# Patient Record
Sex: Female | Born: 1985
Health system: Southern US, Community
[De-identification: ages and names within clinical notes are randomized; demographics above are authoritative.]

## PROBLEM LIST (undated history)

## (undated) ENCOUNTER — Inpatient Hospital Stay (HOSPITAL_COMMUNITY): Payer: Self-pay

## (undated) DIAGNOSIS — D573 Sickle-cell trait: Secondary | ICD-10-CM

## (undated) DIAGNOSIS — H5500 Unspecified nystagmus: Secondary | ICD-10-CM

## (undated) DIAGNOSIS — D649 Anemia, unspecified: Secondary | ICD-10-CM

## (undated) HISTORY — DX: Sickle-cell trait: D57.3

## (undated) HISTORY — PX: CATARACT EXTRACTION: SUR2

## (undated) HISTORY — DX: Anemia, unspecified: D64.9

---
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
## 2007-07-27 ENCOUNTER — Emergency Department (HOSPITAL_COMMUNITY): Admission: EM | Admit: 2007-07-27 | Discharge: 2007-07-27 | Payer: Self-pay | Admitting: Family Medicine

## 2007-07-28 ENCOUNTER — Inpatient Hospital Stay (HOSPITAL_COMMUNITY): Admission: AD | Admit: 2007-07-28 | Discharge: 2007-07-28 | Payer: Self-pay | Admitting: Obstetrics & Gynecology

## 2007-11-07 ENCOUNTER — Inpatient Hospital Stay (HOSPITAL_COMMUNITY): Admission: AD | Admit: 2007-11-07 | Discharge: 2007-11-07 | Payer: Self-pay | Admitting: Obstetrics & Gynecology

## 2008-02-19 ENCOUNTER — Ambulatory Visit (HOSPITAL_COMMUNITY): Admission: RE | Admit: 2008-02-19 | Discharge: 2008-02-19 | Payer: Self-pay | Admitting: Obstetrics and Gynecology

## 2008-02-28 ENCOUNTER — Inpatient Hospital Stay (HOSPITAL_COMMUNITY): Admission: AD | Admit: 2008-02-28 | Discharge: 2008-02-28 | Payer: Self-pay | Admitting: Obstetrics and Gynecology

## 2008-03-29 ENCOUNTER — Encounter (INDEPENDENT_AMBULATORY_CARE_PROVIDER_SITE_OTHER): Payer: Self-pay | Admitting: Obstetrics and Gynecology

## 2008-03-29 ENCOUNTER — Inpatient Hospital Stay (HOSPITAL_COMMUNITY): Admission: AD | Admit: 2008-03-29 | Discharge: 2008-04-01 | Payer: Self-pay | Admitting: Obstetrics and Gynecology

## 2008-12-23 ENCOUNTER — Emergency Department (HOSPITAL_COMMUNITY): Admission: EM | Admit: 2008-12-23 | Discharge: 2008-12-23 | Payer: Self-pay | Admitting: Family Medicine

## 2009-03-28 ENCOUNTER — Emergency Department (HOSPITAL_COMMUNITY): Admission: EM | Admit: 2009-03-28 | Discharge: 2009-03-28 | Payer: Self-pay | Admitting: Emergency Medicine

## 2009-06-22 ENCOUNTER — Emergency Department (HOSPITAL_COMMUNITY): Admission: EM | Admit: 2009-06-22 | Discharge: 2009-06-22 | Payer: Self-pay | Admitting: Family Medicine

## 2009-09-26 ENCOUNTER — Emergency Department (HOSPITAL_COMMUNITY): Admission: EM | Admit: 2009-09-26 | Discharge: 2009-09-26 | Payer: Self-pay | Admitting: Emergency Medicine

## 2010-01-02 ENCOUNTER — Emergency Department (HOSPITAL_COMMUNITY): Admission: EM | Admit: 2010-01-02 | Discharge: 2010-01-02 | Payer: Self-pay | Admitting: Family Medicine

## 2011-01-02 ENCOUNTER — Emergency Department (HOSPITAL_COMMUNITY)
Admission: EM | Admit: 2011-01-02 | Discharge: 2011-01-03 | Disposition: A | Discharge status: Home / Self Care | Payer: Medicaid Other | Attending: Emergency Medicine | Admitting: Emergency Medicine

## 2011-01-02 DIAGNOSIS — K219 Gastro-esophageal reflux disease without esophagitis: Secondary | ICD-10-CM | POA: Insufficient documentation

## 2011-01-02 DIAGNOSIS — R51 Headache: Secondary | ICD-10-CM | POA: Insufficient documentation

## 2011-01-26 LAB — POCT I-STAT, CHEM 8
BUN: 5 mg/dL — ABNORMAL LOW (ref 6–23)
Calcium, Ion: 1.21 mmol/L (ref 1.12–1.32)
Chloride: 102 mEq/L (ref 96–112)
Creatinine, Ser: 0.7 mg/dL (ref 0.4–1.2)
Glucose, Bld: 119 mg/dL — ABNORMAL HIGH (ref 70–99)
HCT: 40 % (ref 36.0–46.0)
Hemoglobin: 13.6 g/dL (ref 12.0–15.0)
Potassium: 4.2 mEq/L (ref 3.5–5.1)
Sodium: 138 mEq/L (ref 135–145)
TCO2: 27 mmol/L (ref 0–100)

## 2011-01-26 LAB — POCT PREGNANCY, URINE: Preg Test, Ur: NEGATIVE

## 2011-01-29 LAB — POCT PREGNANCY, URINE: Preg Test, Ur: NEGATIVE

## 2011-03-06 NOTE — H&P (Signed)
NAME:  Theresa Schroeder, Theresa Schroeder              ACCOUNT NO.:  [id number]   MEDICAL RECORD NO.:  [id number]          PATIENT TYPE:  INP   LOCATION:  9130                          FACILITY:  [facility]   PHYSICIAN:  Crist Fat. Rivard, M.D. DATE OF BIRTH:  1986-07-14   DATE OF ADMISSION:  03/29/2008  DATE OF DISCHARGE:                              HISTORY & PHYSICAL   This is a 25 year old gravida 1, para 0 at 40-2/7 weeks who presents  unannounced by EMS with complaints of contractions since 8 a.m.  She  denies leaking or bleeding and reports positive fetal movement.  Pregnancy has been followed by Dr. Pennie Rushing and remarkable for,  1. Chlamydia in January 2009.  2. Teen.  3. Late care.  4. Sickle cell trait (father of the baby is not tested).  5. Bilateral nystagmus with right strabismus.  6. HPV.  7. Group B strep negative.  8. Fetal pyelectasis on the right.  9. Abnormal Glucola with normal 3-hour GTT.   ALLERGIES:  None.   OB HISTORY:  The patient is a primigravida.   MEDICAL HISTORY:  Remarkable for chlamydia in January 2009 that was  treated and childhood varicella.  She also has a history of sickle cell  trait and HPV with warts, and she also has bilateral nystagmus with  right strabismus of her eyes.   PRENATAL LABS:  Hemoglobin 10.9, platelets 218.  Blood type O positive,  antibody screen negative, RPR nonreactive, sickle cell positive for  trait.  Hepatitis negative, HIV negative.  Cystic fibrosis negative, HSV  1 and 2 both negative.  History of current pregnancy.  The patient  entered care at [redacted] weeks gestation.  She was treated for UTI at that  time.  She had an ultrasound at 28 weeks that was normal except for  right fetal renal pyelectasis, with otherwise normal anatomy.  She had a  colposcopy in April 2009 for low-grade SIL by Dr. Pennie Rushing and had an  elevated Glucola with a normal 3-hour GTT.  Ultrasound at 33 weeks  showed normal growth and resolution of pyelectasis, and she was  group B  strep negative at term.   OBJECTIVE DATA:  VITAL SIGNS:  Stable, afebrile.  Diastolic blood  pressures have been in the 90s.  HEENT:  Within normal limits.  Thyroid normal, not enlarged.  CHEST:  Clear to auscultation.  HEART:  Regular rate and rhythm.  ABDOMEN:  Gravid at 38 cm vertex.  Leopold's exam shows reactive fetal  heart rate with contractions every 2-4 minutes.  Cervix was initially 2  cm and is now 3, 95, and -1 to -2 vertex with positive bloody show.  EXTREMITIES:  Within normal limits.   ASSESSMENT:  1. Intrauterine pregnancy at 40-2/7 weeks.  2. Early labor with cervical change.  3. Questionable pregnancy-induced hypertension versus pain effect.   PLAN:  1. Admit per Dr. Estanislado Pandy.  2. PIH labs.  3. Epidural p.r.n.      Marie L. Williams, C.N.M.      Crist Fat Rivard, M.D.  Electronically Signed    MLW/MEDQ  D:  03/29/2008  T:  03/30/2008  Job:  [id number]

## 2011-03-06 NOTE — Op Note (Signed)
NAME:  BELLATRIX, DEVONSHIRE              ACCOUNT NO.:  [id number]   MEDICAL RECORD NO.:  [id number]          PATIENT TYPE:  INP   LOCATION:  9130                          FACILITY:  [facility]   PHYSICIAN:  Crist Fat. Rivard, M.D. DATE OF BIRTH:  1985-11-23   DATE OF PROCEDURE:  DATE OF DISCHARGE:                               OPERATIVE REPORT   PREOPERATIVE DIAGNOSIS:  Intrauterine pregnancy at 40 weeks and 2 days  with nonreassuring fetal heart rate.   POSTOPERATIVE DIAGNOSIS:  Intrauterine pregnancy at 40 weeks and 2 days  with nonreassuring fetal heart rate.   ANESTHESIA:  Epidural, Dr. Jean Rosenthal.   PROCEDURE:  Primary low transverse cesarean section.   SURGEON:  Crist Fat. Rivard, MD   ASSISTANT:  Elby Showers. Mayford Knife, CNM.   ESTIMATED BLOOD LOSS:  800 mL.   PROCEDURE:  After being informed of the planned procedure with possible  complications including bleeding, infection, and injury to other organs,  informed consent was obtained.  The patient was rapidly taken to OR #1  and pre-existing epidural was reinforced while we monitored the fetal  heart rate.  During the time of reinforcing anesthesia, heart rate  varied between 110 and 125 with no recurrent deceleration.  We then were  able to prep the patient and draped her in a sterile fashion.  A Foley  catheter was already in her bladder, and the fifth scalp lead was then  removed.  After assessing adequate level of anesthesia, we infiltrated  the suprapubic area with 20 mL of Marcaine 0.25 and performed a  Pfannenstiel incision which was brought down sharply to the fascia.  The  fascia was incised in a low transverse fashion.  Linea alba was  dissected.  Peritoneum was entered bluntly.  Visceral peritoneum is  entered in a low transverse fashion allowing [place] to safely retract  bladder by developing a bladder flap.  Grete retractor was put in place  and then the myometrium was entered in a low transverse fashion, first  with knife then  extended bluntly.  Amniotic fluid was clear, and we  assisted the birth of a female infant at 1459, mouth and nose in OP  presentation.  Mouth and nose were suctioned.  Baby was delivered.  A  cord loop around the right ankle was reduced.  Cord was clamped with 2  Kelly clamps and sectioned, and the baby was given to Dr. Joana Reamer,  neonatologist, present in the room.  Cord pH was drawn from the  umbilical artery and 10 mL was drawn from the umbilical vein.  Ancef 1 g  was given IV to the patient.  The placenta was delivered spontaneously.  It was complete.  Cord has 3 vessels and uterine revision was negative.   The myometrium was closed in 2 layers, first with a running locked  suture of 0 Vicryl, then with a Lembert suture of 0 Vicryl imbricating  the first one.  Hemostasis was completed on peritoneal edges with  cauterization.  Both pericolic gutters were cleaned, both tubes and  ovaries assessed and normal, and the pelvis was then profusely irrigated  with warm saline to note a satisfactory hemostasis.   On the fascia, hemostasis was completed with cautery and the fascia was  closed with two running sutures of 1 Vicryl meeting midline.  The wound  was irrigated with warm saline.  Hemostasis was completed with cautery,  and the skin was closed with a subcuticular suture of 3-0 Monocryl and  Steri-Strips.   Instruments and sponge count was complete x2.  Estimated blood loss was  800 mL.  The procedure was very well tolerated by the patient who was  taken to recovery room in a well and stable condition.   Little boy named Cristal Deer was born at 2:59 p.m., received an Apgar of  9 at 1 minute and 9 at 5 minutes, weighed 8 pounds 4 ounces, and had a  cord pH of 7.22.   SPECIMEN:  Placenta was sent to pathology.      Crist Fat Rivard, M.D.  Electronically Signed     SAR/MEDQ  D:  03/29/2008  T:  03/30/2008  Job:  [id number]

## 2011-03-06 NOTE — Discharge Summary (Signed)
Theresa Schroeder, Theresa Schroeder              ACCOUNT NO.:  [id number]   MEDICAL RECORD NO.:  [id number]          PATIENT TYPE:  INP   LOCATION:  9130                          [facility]ACILITY:  [facility]   PHYSICIAN:  Hal Morales, M.D.DATE OF BIRTH:  October 30, 1985   DATE OF ADMISSION:  03/29/2008  DATE OF DISCHARGE:  04/01/2008                               DISCHARGE SUMMARY   ADMITTING DIAGNOSES:  1. Intrauterine pregnancy at 40 and 2/7th weeks.  2. Early labor.   DISCHARGE DIAGNOSES:  1. Intrauterine pregnancy at 40 and 2/7th weeks.  2. Nonreassuring fetal heart rate.   PROCEDURES:  1. Primary low transverse cesarean section.  2. Epidural anesthesia.   HOSPITAL COURSE:  Theresa Schroeder is a 25 year old gravida 1, para 0, 40 and  2/7th weeks who presented in early labor on the morning of March 29, 2008.  Her pregnancy had been remarkable for,  1. Chlamydia in 2009.  2. Teen pregnancy.  3. Late care.  4. Sickle cell trait without father of the baby being tested.  5. Bilateral nystagmus with right strabismus.  6. HPV.  7. Group B strep negative.  8. Fetal pyelectasis in the right.  9. Abnormal glucola with normal 3-hour GTT.   On admission, cervix was 395% vertex -1 to -2.  Uterine contractions  every 2-4 minutes.  She did have some mild elevation of her diastolic  blood pressures.  However, this did resolve with pain management.  She  was admitted to labor and delivery and received an epidural.  At 1:00  p.m. her cervix 490% vertex at -1 station with bulging membranes.  Artificial rupture of membranes was accomplished with clear fluid noted.  There was a slight decrease in the baseline, however, good variability  was noted.  There were some mild variable decelerations.  The positive  accelerations were noted.  Ph labs were negative except for uric acid of  6.2 and amnioinfusion was begun.  Over the next 2 hours, the patient had  some frequency of contractions with some episodes of tachysystole is  causing fetal heart rate decelerations.  Scalp stimulation was always  present, terbutaline subcu had been given x1.  At 2, she had had 3-  minute decelerations with return to baseline at 130s with decreased  variability.  There had been no decelerations over 3-4 contractions.  Observation was continued at that time; however, the increased vigilance  of fetal heart rate evaluation was discussed with the patient.  Over the  next 30 minutes, the patient did have a prolonged decelerations to the  70s.  Terbutaline was given, scalp stimulation was performed without  improvement.  Vaginal exam showed the patient to be 9 cm, 100% effaced,  at 0 station.  She was taken to C-section for emergent delivery.   FINDINGS:  Viable female, Theresa Schroeder, born at 2:59, Apgars were 9 and 9,  weight was 8 pounds 4 ounces, cord pH was 7.22.  There was 1 cord loop  around the right leg.  Placenta was sent to pathology.  The patient  tolerated the procedure well was taken to recovery in good condition.  Infant was taken to the full-term nursery.  By postop day #1, the  patient was doing well.  Her hemoglobin was 8.2 down from 10.5, white  blood cell count was 13.6, and platelet count was 161,000.  The patient  was having no syncope or dizziness.  She was bottle feeding.  Initially,  during her pregnancy, she had discussed giving the baby up for adoption;  however, she elected to keep the baby and did have good support from her  mom.  Rest of the patient's hospital stay was uncomplicated.  By postop  day 3, she was doing well.  She was up ad lib.  She was bottle feeding.  She was undecided about birth control.  Her incision was clean, dry, and  intact.  The subcuticular sutures and Steri-Strips noted.  She was  deemed to receive full benefit of hospital stay and was discharged home.   DISCHARGE INSTRUCTIONS:  Per [facility] handout.   DISCHARGE MEDICATIONS:  1. Motrin 600 mg p.o. q.6 h. p.r.n. pain.   2. Percocet 1-2 p.o. q.3-4 h. p.r.n. pain.  3. Iron supplement 1 p.o. daily will be continued as previous to      admission.   DISCHARGE FOLLOW-UP:  Occur in 6 weeks [facility] or p.r.n.      Theresa Schroeder, C.N.M.      Hal Morales, M.D.  Electronically Signed    VLL/MEDQ  D:  04/01/2008  T:  04/01/2008  Job:  [id number]

## 2011-03-24 ENCOUNTER — Ambulatory Visit (INDEPENDENT_AMBULATORY_CARE_PROVIDER_SITE_OTHER): Payer: Medicaid Other

## 2011-03-24 ENCOUNTER — Inpatient Hospital Stay (INDEPENDENT_AMBULATORY_CARE_PROVIDER_SITE_OTHER)
Admission: RE | Admit: 2011-03-24 | Discharge: 2011-03-24 | Disposition: A | Discharge status: Home / Self Care | Payer: Medicaid Other | Source: Ambulatory Visit | Attending: Family Medicine | Admitting: Family Medicine

## 2011-03-24 DIAGNOSIS — S93409A Sprain of unspecified ligament of unspecified ankle, initial encounter: Secondary | ICD-10-CM

## 2011-07-12 LAB — RUBELLA SCREEN: Rubella: 30.6 — ABNORMAL HIGH

## 2011-07-12 LAB — DIFFERENTIAL
Eosinophils Absolute: 0
Lymphocytes Relative: 19
Lymphs Abs: 1.1
Monocytes Relative: 6
Neutro Abs: 4.6
Neutrophils Relative %: 75

## 2011-07-12 LAB — GC/CHLAMYDIA PROBE AMP, GENITAL: Chlamydia, DNA Probe: POSITIVE — AB

## 2011-07-12 LAB — CBC
MCV: 92.4
RBC: 3.11 — ABNORMAL LOW
WBC: 6.1

## 2011-07-12 LAB — WET PREP, GENITAL
Clue Cells Wet Prep HPF POC: NONE SEEN
Trich, Wet Prep: NONE SEEN
Yeast Wet Prep HPF POC: NONE SEEN

## 2011-07-12 LAB — SICKLE CELL SCREEN: Sickle Cell Screen: POSITIVE — AB

## 2011-07-12 LAB — URINALYSIS, ROUTINE W REFLEX MICROSCOPIC
Glucose, UA: NEGATIVE
Hgb urine dipstick: NEGATIVE
Protein, ur: NEGATIVE
pH: 6

## 2011-07-12 LAB — URINE MICROSCOPIC-ADD ON

## 2011-07-12 LAB — RPR: RPR Ser Ql: NONREACTIVE

## 2011-07-12 LAB — HEPATITIS B SURFACE ANTIGEN: Hepatitis B Surface Ag: NEGATIVE

## 2011-07-12 LAB — ABO/RH: ABO/RH(D): O POS

## 2011-07-12 LAB — URINE CULTURE

## 2011-07-19 LAB — CBC
HCT: 23.9 — ABNORMAL LOW
Hemoglobin: 8.2 — ABNORMAL LOW
MCHC: 34.1
Platelets: 227
RBC: 2.89 — ABNORMAL LOW
RDW: 15.5
RDW: 15.7 — ABNORMAL HIGH
WBC: 8.8

## 2011-07-19 LAB — COMPREHENSIVE METABOLIC PANEL
AST: 18
Albumin: 3 — ABNORMAL LOW
Alkaline Phosphatase: 149 — ABNORMAL HIGH
BUN: 3 — ABNORMAL LOW
Chloride: 105
GFR calc Af Amer: >60
Potassium: 3.8
Total Bilirubin: 0.6
Total Protein: 6.4

## 2011-07-19 LAB — URIC ACID: Uric Acid, Serum: 6.2

## 2011-07-19 LAB — RPR: RPR Ser Ql: NONREACTIVE

## 2011-08-02 LAB — CBC
HCT: 35.2 — ABNORMAL LOW
Hemoglobin: 12.1
MCV: 87.8
RBC: 4.01
WBC: 12 — ABNORMAL HIGH

## 2011-08-02 LAB — POCT URINALYSIS DIP (DEVICE)
Glucose, UA: NEGATIVE
Specific Gravity, Urine: 1.01
pH: 6

## 2011-08-02 LAB — URINE CULTURE: Colony Count: >=100000

## 2011-08-02 LAB — URINALYSIS, ROUTINE W REFLEX MICROSCOPIC
Glucose, UA: 100 — AB
Ketones, ur: 15 — AB
Protein, ur: 30 — AB

## 2011-08-02 LAB — WET PREP, GENITAL

## 2011-08-02 LAB — RPR: RPR Ser Ql: NONREACTIVE

## 2011-08-02 LAB — POCT PREGNANCY, URINE: Operator id: 200941

## 2011-08-02 LAB — HCG, QUANTITATIVE, PREGNANCY: hCG, Beta Chain, Quant, S: 12854 — ABNORMAL HIGH

## 2011-08-02 LAB — GC/CHLAMYDIA PROBE AMP, GENITAL
Chlamydia, DNA Probe: NEGATIVE
GC Probe Amp, Genital: NEGATIVE

## 2011-10-23 NOTE — L&D Delivery Note (Signed)
Delivery Note Pushed well with rapid progression to crowing.  At 8:27 PM a viable and healthy female was delivered via Vaginal, Spontaneous Delivery (Presentation: ; Occiput Anterior).  APGAR: 9, 9; weight 6 lb 8.8 oz (2970 g).   Placenta status: Intact, Spontaneous.  Cord: 3 vessels with the following complications: None.  Cord pH:   No difficulty with shoulders Nuchal cord X 1, delivered through  Anesthesia: Epidural  Episiotomy:  Lacerations: 2nd degree;Labial Suture Repair: 3.0 vicryl Est. Blood Loss (mL):   Mom to postpartum.  Baby to nursery-stable.  [facility] 01/25/2012, 8:51 PM

## 2011-12-31 ENCOUNTER — Encounter: Payer: Self-pay | Admitting: Obstetrics & Gynecology

## 2011-12-31 ENCOUNTER — Encounter (HOSPITAL_COMMUNITY): Payer: Self-pay

## 2011-12-31 ENCOUNTER — Inpatient Hospital Stay (HOSPITAL_COMMUNITY): Payer: Medicaid Other

## 2011-12-31 ENCOUNTER — Inpatient Hospital Stay (HOSPITAL_COMMUNITY)
Admission: AD | Admit: 2011-12-31 | Discharge: 2011-12-31 | Disposition: A | Discharge status: Home / Self Care | Payer: Medicaid Other | Source: Ambulatory Visit | Attending: Obstetrics & Gynecology | Admitting: Obstetrics & Gynecology

## 2011-12-31 DIAGNOSIS — O368190 Decreased fetal movements, unspecified trimester, not applicable or unspecified: Secondary | ICD-10-CM | POA: Insufficient documentation

## 2011-12-31 DIAGNOSIS — Z3490 Encounter for supervision of normal pregnancy, unspecified, unspecified trimester: Secondary | ICD-10-CM | POA: Insufficient documentation

## 2011-12-31 DIAGNOSIS — O0930 Supervision of pregnancy with insufficient antenatal care, unspecified trimester: Secondary | ICD-10-CM | POA: Insufficient documentation

## 2011-12-31 HISTORY — DX: Unspecified nystagmus: H55.00

## 2011-12-31 LAB — DIFFERENTIAL
Eosinophils Absolute: 0.1 10*3/uL (ref 0.0–0.7)
Eosinophils Relative: 1 % (ref 0–5)
Lymphs Abs: 1.4 10*3/uL (ref 0.7–4.0)

## 2011-12-31 LAB — HIV ANTIBODY (ROUTINE TESTING W REFLEX): HIV: NONREACTIVE

## 2011-12-31 LAB — CBC
MCH: 28.5 pg (ref 26.0–34.0)
MCV: 85.9 fL (ref 78.0–100.0)
Platelets: 241 10*3/uL (ref 150–400)
RBC: 3.33 MIL/uL — ABNORMAL LOW (ref 3.87–5.11)

## 2011-12-31 LAB — HEPATITIS B SURFACE ANTIGEN: Hepatitis B Surface Ag: NEGATIVE

## 2011-12-31 LAB — GC/CHLAMYDIA PROBE AMP, GENITAL: Chlamydia: NEGATIVE

## 2011-12-31 MED ORDER — FERROUS SULFATE 325 (65 FE) MG PO TABS: 325.0000 mg | ORAL_TABLET | Freq: Every day | ORAL | Status: DC

## 2011-12-31 NOTE — MAU Provider Note (Signed)
Medical Screening exam and patient care preformed by advanced practice provider.  Agree with the above management.  

## 2011-12-31 NOTE — MAU Provider Note (Signed)
History     No chief complaint on file.  HPI 26 yo G3P1011 at 35 weeks by LMP with no prenatal care presenting to MAU with chief complaint of "baby not moving much" for 2 weeks Last time she felt increased movement was a few days ago. States she has some movement but not as forceful as before. No contractions, some pelvic pressure. No vaginal bleeding, no loss of fluid. Has some mucous-like discharge  OB History    Grav Para Term Preterm Abortions TAB SAB Ect Mult Living   3 1 1  0 1 1 0 0 0 1    Last delivery was C-section at [facility] in June 2009 due to fetal distress? Abortion in [place] 03/03/11- Found through record search. Patient is embarrassed by this and would not like family members to know.  Past Medical History  Diagnosis Date  . Nystagmus     Past Surgical History  Procedure Date  . Cesarean section     Family History  Problem Relation Age of Onset  . Anesthesia problems Neg Hx     History  Substance Use Topics  . Smoking status: Never Smoker   . Smokeless tobacco: Not on file  . Alcohol Use:     Allergies: No Known Allergies  Prescriptions prior to admission  Medication Sig Dispense Refill  . Prenatal Vit-Fe Fumarate-FA (PRENATAL MULTIVITAMIN) TABS Take 1 tablet by mouth daily.      Marland Kitchen DISCONTD: ibuprofen (ADVIL,MOTRIN) 200 MG tablet Take 400 mg by mouth daily as needed. For headache        Review of Systems  Constitutional: Negative for fever and chills.  Respiratory: Negative for shortness of breath.   Cardiovascular: Negative for chest pain.  Gastrointestinal: Positive for vomiting. Negative for diarrhea and constipation.  Genitourinary: Negative for dysuria.  Musculoskeletal: Negative for back pain.  Skin: Negative for rash.  Neurological: Positive for headaches. Negative for dizziness.  Psychiatric/Behavioral: Negative for depression and suicidal ideas.   Physical Exam   Last menstrual period 04/30/2011, unknown if currently  breastfeeding.  Physical Exam  Constitutional: She is oriented to person, place, and time. She appears well-developed and well-nourished. No distress.  HENT:  Head: Normocephalic and atraumatic.  Eyes:       Nystagmus at baseline  Neck: Normal range of motion.  Cardiovascular: Normal rate and regular rhythm.   No murmur heard. Respiratory: Effort normal and breath sounds normal. She has no wheezes.  GI: Soft.       Gravid. Toco in place.  Musculoskeletal: Normal range of motion. She exhibits no edema and no tenderness.  Neurological: She is alert and oriented to person, place, and time.  Skin: Skin is dry. No rash noted.    MAU Course  Procedures Results for orders placed during the hospital encounter of 12/31/11 (from the past 24 hour(s))  CBC     Status: Abnormal   Collection Time   12/31/11 12:25 PM      Component Value Range   WBC 7.6  4.0 - 10.5 (K/uL)   RBC 3.33 (*) 3.87 - 5.11 (MIL/uL)   Hemoglobin 9.5 (*) 12.0 - 15.0 (g/dL)   HCT 16.1 (*) 09.6 - 46.0 (%)   MCV 85.9  78.0 - 100.0 (fL)   MCH 28.5  26.0 - 34.0 (pg)   MCHC 33.2  30.0 - 36.0 (g/dL)   RDW 04.5  40.9 - 81.1 (%)   Platelets 241  150 - 400 (K/uL)  DIFFERENTIAL  Status: Normal   Collection Time   12/31/11 12:25 PM      Component Value Range   Neutrophils Relative 75  43 - 77 (%)   Neutro Abs 5.7  1.7 - 7.7 (K/uL)   Lymphocytes Relative 18  12 - 46 (%)   Lymphs Abs 1.4  0.7 - 4.0 (K/uL)   Monocytes Relative 6  3 - 12 (%)   Monocytes Absolute 0.5  0.1 - 1.0 (K/uL)   Eosinophils Relative 1  0 - 5 (%)   Eosinophils Absolute 0.1  0.0 - 0.7 (K/uL)   Basophils Relative 0  0 - 1 (%)   Basophils Absolute 0.0  0.0 - 0.1 (K/uL)  TYPE AND SCREEN     Status: Normal   Collection Time   12/31/11 12:26 PM      Component Value Range   ABO/RH(D) O POS     Antibody Screen NEG     Sample Expiration 01/03/2012      MDM Patient has no prenatal care, and states she has decreased fetal movement. Will order OB+14  ultrasound for growth, and BPP for decreased movement. Since she has not had prenatal care, will order initial OB labs. Patient's dating by LMP is 35w 0d. By late ultrasound, [redacted]w[redacted]d. Since GBS is only good for 4 weeks, this has been deferred given her unsure dating. Will do CG/Ch on Urine. Pelvic exam deferred since she has no contractions, no pain, no LOF.  No contractions noted on monitor. FHT reassuring with moderate variability, baseline in the 140's.  Assessment and Plan  26 yo G3P1011 at 35 weeks by LMP presenting for decreased fetal movement - US shows normal IUP at [redacted]w[redacted]d. BPP within normal limits - Initial OB labs completed today. - Will give Rx for iron to take daily given HgB of 9.5 - Patient will follow up in [facility] on 3/27 at 9:30am. Will need GBS and possibly pap smear at that appointment. Patient is aware of this appointment and was encouraged multiple times to keep appointment. - Discussed with Dr. Adrian Blackwater and Georges Mouse who agree with above plan.  Edmundo Tedesco 12/31/2011, 2:24 PM

## 2011-12-31 NOTE — Plan of Care (Signed)
Patient is not in the lobby when called to a room in MAU.  

## 2012-01-01 LAB — GC/CHLAMYDIA PROBE AMP, URINE: GC Probe Amp, Urine: NEGATIVE

## 2012-01-02 ENCOUNTER — Encounter: Payer: Self-pay | Admitting: Obstetrics & Gynecology

## 2012-01-02 ENCOUNTER — Other Ambulatory Visit: Payer: Self-pay | Admitting: Obstetrics & Gynecology

## 2012-01-02 DIAGNOSIS — Z130 Encounter for screening for diseases of the blood and blood-forming organs and certain disorders involving the immune mechanism: Secondary | ICD-10-CM

## 2012-01-02 LAB — SICKLE CELL SCREEN: Sickle Cell Screen: POSITIVE — AB

## 2012-01-02 NOTE — Progress Notes (Signed)
Future lab order noted, will be drawn at pts OB appt.

## 2012-01-16 ENCOUNTER — Other Ambulatory Visit: Payer: Self-pay | Admitting: *Deleted

## 2012-01-16 ENCOUNTER — Encounter: Payer: Self-pay | Admitting: Advanced Practice Midwife

## 2012-01-16 ENCOUNTER — Other Ambulatory Visit (HOSPITAL_COMMUNITY)
Admission: RE | Admit: 2012-01-16 | Discharge: 2012-01-16 | Disposition: A | Discharge status: Home / Self Care | Payer: Medicaid Other | Source: Ambulatory Visit | Attending: Advanced Practice Midwife | Admitting: Advanced Practice Midwife

## 2012-01-16 ENCOUNTER — Ambulatory Visit (INDEPENDENT_AMBULATORY_CARE_PROVIDER_SITE_OTHER): Payer: Medicaid Other | Admitting: Advanced Practice Midwife

## 2012-01-16 VITALS — BP 131/89 | Temp 97.0°F | Ht 62.0 in | Wt 186.4 lb

## 2012-01-16 DIAGNOSIS — D573 Sickle-cell trait: Secondary | ICD-10-CM

## 2012-01-16 DIAGNOSIS — O0930 Supervision of pregnancy with insufficient antenatal care, unspecified trimester: Secondary | ICD-10-CM

## 2012-01-16 DIAGNOSIS — Z3490 Encounter for supervision of normal pregnancy, unspecified, unspecified trimester: Secondary | ICD-10-CM

## 2012-01-16 DIAGNOSIS — O34219 Maternal care for unspecified type scar from previous cesarean delivery: Secondary | ICD-10-CM

## 2012-01-16 DIAGNOSIS — Z113 Encounter for screening for infections with a predominantly sexual mode of transmission: Secondary | ICD-10-CM | POA: Insufficient documentation

## 2012-01-16 DIAGNOSIS — Z130 Encounter for screening for diseases of the blood and blood-forming organs and certain disorders involving the immune mechanism: Secondary | ICD-10-CM

## 2012-01-16 DIAGNOSIS — Z01419 Encounter for gynecological examination (general) (routine) without abnormal findings: Secondary | ICD-10-CM | POA: Insufficient documentation

## 2012-01-16 LAB — POCT URINALYSIS DIP (DEVICE)
Bilirubin Urine: NEGATIVE
Glucose, UA: NEGATIVE mg/dL
Hgb urine dipstick: NEGATIVE
Nitrite: NEGATIVE

## 2012-01-16 MED ORDER — ONDANSETRON HCL 4 MG PO TABS: 4.0000 mg | ORAL_TABLET | Freq: Three times a day (TID) | ORAL | Status: DC | PRN

## 2012-01-16 MED ORDER — ONDANSETRON HCL 8 MG PO TABS: 4.0000 mg | ORAL_TABLET | Freq: Three times a day (TID) | ORAL | Status: AC | PRN

## 2012-01-16 NOTE — Progress Notes (Signed)
Pulse- 108  Edema-ankles.  Pressure- right side.  Vaginal discharge- clear Pt c/o "cough a lot, constipation, vomiting throughout the day, continues to feel nauseous" Pt given education booklet and WIC info.

## 2012-01-16 NOTE — Progress Notes (Signed)
Subjective:    Theresa Schroeder is a [id number] [redacted]w[redacted]d being seen today for her first obstetrical visit.  Her obstetrical history is significant for h/o C/S 3 years ago, sickle cell screen +, late to prenatal care. Patient does intend to breast feed. Pregnancy history fully reviewed.  Patient reports nausea, no bleeding, no contractions, no cramping, no leaking and vomiting.  One concern is that baby is not moving as much as before.  Was late to care because she felt like she was otherwise healthy and didn't need it.  Filed Vitals:   01/16/12 0928 01/16/12 0934  BP: 131/89   Temp: 97 F (36.1 C)   Height:  5\' 2"  (1.575 m)  Weight: 186 lb 6.4 oz (84.55 kg)     HISTORY: OB History    Grav Para Term Preterm Abortions TAB SAB Ect Mult Living   3 1 1  0 1 1 0 0 0 1     # Outc Date GA Lbr Len/2nd Wgt Sex Del Anes PTL Lv   1 TRM 6/09 [redacted]w[redacted]d  8lb4oz(3.742kg) F LTCS EPI  Yes   Comments: fetal distress   2 TAB 5/12           Comments: "alot of pain in legs in pregnancy, stomach area"   3 CUR              Past Medical History  Diagnosis Date  . Nystagmus   . Anemia     sickle cell anemia   Past Surgical History  Procedure Date  . Cesarean section    Family History  Problem Relation Age of Onset  . Anesthesia problems Neg Hx      Exam    Uterine Size: 34 cm and size equals dates  Pelvic Exam:    Perineum: No Hemorrhoids   Vulva: normal   Vagina:  normal mucosa, normal discharge       Cervix: no bleeding following Pap, no cervical motion tenderness and no lesions   Adnexa: normal adnexa      System:     Skin: normal coloration and turgor, no rashes    Neurologic: oriented, normal, grossly non-focal, nystagmus bilaterally, left eye worse than right; stabismus   Extremities: normal strength, tone, and muscle mass, no deformities, no erythema, induration, or nodules   HEENT PERRLA, thyroid without masses and strabismus and nystagmus   Mouth/Teeth mucous membranes moist,  pharynx normal without lesions and dental hygiene poor   Neck supple   Cardiovascular: regular rate and rhythm, no murmurs or gallops   Respiratory:  appears well, vitals normal, no respiratory distress, acyanotic, normal RR, ear and throat exam is normal, neck free of mass or lymphadenopathy, chest clear, no wheezing, crepitations, rhonchi, normal symmetric air entry   Abdomen: gravid          Assessment:    Pregnancy: G3P1011 Patient Active Problem List  Diagnoses  . Supervision of normal pregnancy  . Sickle cell test positive, need hgb electrophoresis        Plan:     Initial labs drawn in MAU. PAP, GBS done today. UNABLE TO DRAWN 1HR GTT due to patient vomiting.  Will need to repeat.  Sickle cell screen positive; additional labs drawn for confirmation today (electrophoresis).  Prenatal vitamins and iron. Problem list reviewed and updated. Genetic Screening: too late to care  Ultrasound discussed;  Follow up in 1 weeks.  VBAC CONSENT DONE. Pt would like to try TOLAC, risks and benefits discussed. Consent  signed. Patient given a copy to take home with her.     Wendall Isabell 01/16/2012

## 2012-01-16 NOTE — Patient Instructions (Addendum)
Come back tomorrow for your sugar test. I am sending in a medicine for you to take 30 minutes BEFORE you come for your test to help with nausea.  Pregnancy - Third Trimester The third trimester of pregnancy (the last 3 months) is a period of the most rapid growth for you and your baby. The baby approaches a length of 20 inches and a weight of 6 to 10 pounds. The baby is adding on fat and getting ready for life outside your body. While inside, babies have periods of sleeping and waking, suck their thumbs, and hiccups. You can often feel small contractions of the uterus. This is false labor. It is also called Braxton-Hicks contractions. This is like a practice for labor. The usual problems in this stage of pregnancy include more difficulty breathing, swelling of the hands and feet from water retention, and having to urinate more often because of the uterus and baby pressing on your bladder.  PRENATAL EXAMS  Blood work may continue to be done during prenatal exams. These tests are done to check on your health and the probable health of your baby. Blood work is used to follow your blood levels (hemoglobin). Anemia (low hemoglobin) is common during pregnancy. Iron and vitamins are given to help prevent this. You may also continue to be checked for diabetes. Some of the past blood tests may be done again.   The size of the uterus is measured during each visit. This makes sure your baby is growing properly according to your pregnancy dates.   Your blood pressure is checked every prenatal visit. This is to make sure you are not getting toxemia.   Your urine is checked every prenatal visit for infection, diabetes and protein.   Your weight is checked at each visit. This is done to make sure gains are happening at the suggested rate and that you and your baby are growing normally.   Sometimes, an ultrasound is performed to confirm the position and the proper growth and development of the baby. This is a test  done that bounces harmless sound waves off the baby so your caregiver can more accurately determine due dates.   Discuss the type of pain medication and anesthesia you will have during your labor and delivery.   Discuss the possibility and anesthesia if a Cesarean Section might be necessary.   Inform your caregiver if there is any mental or physical violence at home.  Sometimes, a specialized non-stress test, contraction stress test and biophysical profile are done to make sure the baby is not having a problem. Checking the amniotic fluid surrounding the baby is called an amniocentesis. The amniotic fluid is removed by sticking a needle into the belly (abdomen). This is sometimes done near the end of pregnancy if an early delivery is required. In this case, it is done to help make sure the baby's lungs are mature enough for the baby to live outside of the womb. If the lungs are not mature and it is unsafe to deliver the baby, an injection of cortisone medication is given to the mother 1 to 2 days before the delivery. This helps the baby's lungs mature and makes it safer to deliver the baby. CHANGES OCCURING IN THE THIRD TRIMESTER OF PREGNANCY Your body goes through many changes during pregnancy. They vary from person to person. Talk to your caregiver about changes you notice and are concerned about.  During the last trimester, you have probably had an increase in your appetite. It  is normal to have cravings for certain foods. This varies from person to person and pregnancy to pregnancy.   You may begin to get stretch marks on your hips, abdomen, and breasts. These are normal changes in the body during pregnancy. There are no exercises or medications to take which prevent this change.   Constipation may be treated with a stool softener or adding bulk to your diet. Drinking lots of fluids, fiber in vegetables, fruits, and whole grains are helpful.   Exercising is also helpful. If you have been very  active up until your pregnancy, most of these activities can be continued during your pregnancy. If you have been less active, it is helpful to start an exercise program such as walking. Consult your caregiver before starting exercise programs.   Avoid all smoking, alcohol, un-prescribed drugs, herbs and "street drugs" during your pregnancy. These chemicals affect the formation and growth of the baby. Avoid chemicals throughout the pregnancy to ensure the delivery of a healthy infant.   Backache, varicose veins and hemorrhoids may develop or get worse.   You will tire more easily in the third trimester, which is normal.   The baby's movements may be stronger and more often.   You may become short of breath easily.   Your belly button may stick out.   A yellow discharge may leak from your breasts called colostrum.   You may have a bloody mucus discharge. This usually occurs a few days to a week before labor begins.  HOME CARE INSTRUCTIONS   Keep your caregiver's appointments. Follow your caregiver's instructions regarding medication use, exercise, and diet.   During pregnancy, you are providing food for you and your baby. Continue to eat regular, well-balanced meals. Choose foods such as meat, fish, milk and other low fat dairy products, vegetables, fruits, and whole-grain breads and cereals. Your caregiver will tell you of the ideal weight gain.   A physical sexual relationship may be continued throughout pregnancy if there are no other problems such as early (premature) leaking of amniotic fluid from the membranes, vaginal bleeding, or belly (abdominal) pain.   Exercise regularly if there are no restrictions. Check with your caregiver if you are unsure of the safety of your exercises. Greater weight gain will occur in the last 2 trimesters of pregnancy. Exercising helps:   Control your weight.   Get you in shape for labor and delivery.   You lose weight after you deliver.   Rest a  lot with legs elevated, or as needed for leg cramps or low back pain.   Wear a good support or jogging bra for breast tenderness during pregnancy. This may help if worn during sleep. Pads or tissues may be used in the bra if you are leaking colostrum.   Do not use hot tubs, steam rooms, or saunas.   Wear your seat belt when driving. This protects you and your baby if you are in an accident.   Avoid raw meat, cat litter boxes and soil used by cats. These carry germs that can cause birth defects in the baby.   It is easier to loose urine during pregnancy. Tightening up and strengthening the pelvic muscles will help with this problem. You can practice stopping your urination while you are going to the bathroom. These are the same muscles you need to strengthen. It is also the muscles you would use if you were trying to stop from passing gas. You can practice tightening these muscles up 10  times a set and repeating this about 3 times per day. Once you know what muscles to tighten up, do not perform these exercises during urination. It is more likely to cause an infection by backing up the urine.   Ask for help if you have financial, counseling or nutritional needs during pregnancy. Your caregiver will be able to offer counseling for these needs as well as refer you for other special needs.   Make a list of emergency phone numbers and have them available.   Plan on getting help from family or friends when you go home from the hospital.   Make a trial run to the hospital.   Take prenatal classes with the father to understand, practice and ask questions about the labor and delivery.   Prepare the baby's room/nursery.   Do not travel out of the city unless it is absolutely necessary and with the advice of your caregiver.   Wear only low or no heal shoes to have better balance and prevent falling.  MEDICATIONS AND DRUG USE IN PREGNANCY  Take prenatal vitamins as directed. The vitamin should  contain 1 milligram of folic acid. Keep all vitamins out of reach of children. Only a couple vitamins or tablets containing iron may be fatal to a baby or young child when ingested.   Avoid use of all medications, including herbs, over-the-counter medications, not prescribed or suggested by your caregiver. Only take over-the-counter or prescription medicines for pain, discomfort, or fever as directed by your caregiver. Do not use aspirin, ibuprofen (Motrin, Advil, Nuprin) or naproxen (Aleve) unless OK'd by your caregiver.   Let your caregiver also know about herbs you may be using.   Alcohol is related to a number of birth defects. This includes fetal alcohol syndrome. All alcohol, in any form, should be avoided completely. Smoking will cause low birth rate and premature babies.   Street/illegal drugs are very harmful to the baby. They are absolutely forbidden. A baby born to an addicted mother will be addicted at birth. The baby will go through the same withdrawal an adult does.  SEEK MEDICAL CARE IF: You have any concerns or worries during your pregnancy. It is better to call with your questions if you feel they cannot wait, rather than worry about them. DECISIONS ABOUT CIRCUMCISION You may or may not know the sex of your baby. If you know your baby is a boy, it may be time to think about circumcision. Circumcision is the removal of the foreskin of the penis. This is the skin that covers the sensitive end of the penis. There is no proven medical need for this. Often this decision is made on what is popular at the time or based upon religious beliefs and social issues. You can discuss these issues with your caregiver or pediatrician. SEEK IMMEDIATE MEDICAL CARE IF:   An unexplained oral temperature above 102 F (38.9 C) develops, or as your caregiver suggests.   You have leaking of fluid from the vagina (birth canal). If leaking membranes are suspected, take your temperature and tell your  caregiver of this when you call.   There is vaginal spotting, bleeding or passing clots. Tell your caregiver of the amount and how many pads are used.   You develop a bad smelling vaginal discharge with a change in the color from clear to white.   You develop vomiting that lasts more than 24 hours.   You develop chills or fever.   You develop shortness of breath.  You develop burning on urination.   You loose more than 2 pounds of weight or gain more than 2 pounds of weight or as suggested by your caregiver.   You notice sudden swelling of your face, hands, and feet or legs.   You develop belly (abdominal) pain. Round ligament discomfort is a common non-cancerous (benign) cause of abdominal pain in pregnancy. Your caregiver still must evaluate you.   You develop a severe headache that does not go away.   You develop visual problems, blurred or double vision.   If you have not felt your baby move for more than 1 hour. If you think the baby is not moving as much as usual, eat something with sugar in it and lie down on your left side for an hour. The baby should move at least 4 to 5 times per hour. Call right away if your baby moves less than that.   You fall, are in a car accident or any kind of trauma.   There is mental or physical violence at home.  Document Released: 10/02/2001 Document Revised: 09/27/2011 Document Reviewed: 04/06/2009 [facility] Patient Information 2012 [place], [place]. Contraception Choices Contraception (birth control) is the use of any methods or devices to prevent pregnancy. Below are some methods to help avoid pregnancy. HORMONAL METHODS   Contraceptive implant. This is a thin, plastic tube containing progesterone hormone. It does not contain estrogen hormone. Your caregiver inserts the tube in the inner part of the upper arm. The tube can remain in place for up to 3 years. After 3 years, the implant must be removed. The implant prevents the ovaries from  releasing an egg (ovulation), thickens the cervical mucus which prevents sperm from entering the uterus, and thins the lining of the inside of the uterus.   Progesterone-only injections. These injections are given every 3 months by your caregiver to prevent pregnancy. This synthetic progesterone hormone stops the ovaries from releasing eggs. It also thickens cervical mucus and changes the uterine lining. This makes it harder for sperm to survive in the uterus.   Birth control pills. These pills contain estrogen and progesterone hormone. They work by stopping the egg from forming in the ovary (ovulation). Birth control pills are prescribed by a caregiver.Birth control pills can also be used to treat heavy periods.   Minipill. This type of birth control pill contains only the progesterone hormone. They are taken every day of each month and must be prescribed by your caregiver.   Birth control patch. The patch contains hormones similar to those in birth control pills. It must be changed once a week and is prescribed by a caregiver.   Vaginal ring. The ring contains hormones similar to those in birth control pills. It is left in the vagina for 3 weeks, removed for 1 week, and then a new one is put back in place. The patient must be comfortable inserting and removing the ring from the vagina.A caregiver's prescription is necessary.   Emergency contraception. Emergency contraceptives prevent pregnancy after unprotected sexual intercourse. This pill can be taken right after sex or up to 5 days after unprotected sex. It is most effective the sooner you take the pills after having sexual intercourse. Emergency contraceptive pills are available without a prescription. Check with your pharmacist. Do not use emergency contraception as your only form of birth control.  BARRIER METHODS   Female condom. This is a thin sheath (latex or rubber) that is worn over the penis during sexual intercourse. It  can be used with  spermicide to increase effectiveness.   Female condom. This is a soft, loose-fitting sheath that is put into the vagina before sexual intercourse.   Diaphragm. This is a soft, latex, dome-shaped barrier that must be fitted by a caregiver. It is inserted into the vagina, along with a spermicidal jelly. It is inserted before intercourse. The diaphragm should be left in the vagina for 6 to 8 hours after intercourse.   Cervical cap. This is a round, soft, latex or plastic cup that fits over the cervix and must be fitted by a caregiver. The cap can be left in place for up to 48 hours after intercourse.   Sponge. This is a soft, circular piece of polyurethane foam. The sponge has spermicide in it. It is inserted into the vagina after wetting it and before sexual intercourse.   Spermicides. These are chemicals that kill or block sperm from entering the cervix and uterus. They come in the form of creams, jellies, suppositories, foam, or tablets. They do not require a prescription. They are inserted into the vagina with an applicator before having sexual intercourse. The process must be repeated every time you have sexual intercourse.  INTRAUTERINE CONTRACEPTION  Intrauterine device (IUD). This is a T-shaped device that is put in a woman's uterus during a menstrual period to prevent pregnancy. There are 2 types:   Copper IUD. This type of IUD is wrapped in copper wire and is placed inside the uterus. Copper makes the uterus and fallopian tubes produce a fluid that kills sperm. It can stay in place for 10 years.   Hormone IUD. This type of IUD contains the hormone progestin (synthetic progesterone). The hormone thickens the cervical mucus and prevents sperm from entering the uterus, and it also thins the uterine lining to prevent implantation of a fertilized egg. The hormone can weaken or kill the sperm that get into the uterus. It can stay in place for 5 years.  PERMANENT METHODS OF CONTRACEPTION  Female  tubal ligation. This is when the woman's fallopian tubes are surgically sealed, tied, or blocked to prevent the egg from traveling to the uterus.   Female sterilization. This is when the female has the tubes that carry sperm tied off (vasectomy).This blocks sperm from entering the vagina during sexual intercourse. After the procedure, the man can still ejaculate fluid (semen).  NATURAL PLANNING METHODS  Natural family planning. This is not having sexual intercourse or using a barrier method (condom, diaphragm, cervical cap) on days the woman could become pregnant.   Calendar method. This is keeping track of the length of each menstrual cycle and identifying when you are fertile.   Ovulation method. This is avoiding sexual intercourse during ovulation.   Symptothermal method. This is avoiding sexual intercourse during ovulation, using a thermometer and ovulation symptoms.   Post-ovulation method. This is timing sexual intercourse after you have ovulated.  Regardless of which type or method of contraception you choose, it is important that you use condoms to protect against the transmission of sexually transmitted diseases (STDs). Talk with your caregiver about which form of contraception is most appropriate for you. Document Released: 10/08/2005 Document Revised: 09/27/2011 Document Reviewed: 02/14/2011 [facility] Patient Information 2012 [place], [place]. Breastfeeding BENEFITS OF BREASTFEEDING For the baby  The first milk (colostrum) helps the baby's digestive system function better.   There are antibodies from the mother in the milk that help the baby fight off infections.   The baby has a lower incidence  of asthma, allergies, and SIDS (sudden infant death syndrome).   The nutrients in breast milk are better than formulas for the baby and helps the baby's brain grow better.   Babies who breastfeed have less gas, colic, and constipation.  For the mother  Breastfeeding helps develop a very  special bond between mother and baby.   It is more convenient, always available at the correct temperature and cheaper than formula feeding.   It burns calories in the mother and helps with losing weight that was gained during pregnancy.   It makes the uterus contract back down to normal size faster and slows bleeding following delivery.   Breastfeeding mothers have a lower risk of developing breast cancer.  NURSE FREQUENTLY  A healthy, full-term baby may breastfeed as often as every hour or space his or her feedings to every 3 hours.   How often to nurse will vary from baby to baby. Watch your baby for signs of hunger, not the clock.   Nurse as often as the baby requests, or when you feel the need to reduce the fullness of your breasts.   Awaken the baby if it has been 3 to 4 hours since the last feeding.   Frequent feeding will help the mother make more milk and will prevent problems like sore nipples and engorgement of the breasts.  BABY'S POSITION AT THE BREAST  Whether lying down or sitting, be sure that the baby's tummy is facing your tummy.   Support the breast with 4 fingers underneath the breast and the thumb above. Make sure your fingers are well away from the nipple and baby's mouth.   Stroke the baby's lips and cheek closest to the breast gently with your finger or nipple.   When the baby's mouth is open wide enough, place all of your nipple and as much of the dark area around the nipple as possible into your baby's mouth.   Pull the baby in close so the tip of the nose and the baby's cheeks touch the breast during the feeding.  FEEDINGS  The length of each feeding varies from baby to baby and from feeding to feeding.   The baby must suck about 2 to 3 minutes for your milk to get to him or her. This is called a "let down." For this reason, allow the baby to feed on each breast as long as he or she wants. Your baby will end the feeding when he or she has received the  right balance of nutrients.   To break the suction, put your finger into the corner of the baby's mouth and slide it between his or her gums before removing your breast from his or her mouth. This will help prevent sore nipples.  REDUCING BREAST ENGORGEMENT  In the first week after your baby is born, you may experience signs of breast engorgement. When breasts are engorged, they feel heavy, warm, full, and may be tender to the touch. You can reduce engorgement if you:   Nurse frequently, every 2 to 3 hours. Mothers who breastfeed early and often have fewer problems with engorgement.   Place light ice packs on your breasts between feedings. This reduces swelling. Wrap the ice packs in a lightweight towel to protect your skin.   Apply moist hot packs to your breast for 5 to 10 minutes before each feeding. This increases circulation and helps the milk flow.   Gently massage your breast before and during the feeding.  Make sure that the baby empties at least one breast at every feeding before switching sides.   Use a breast pump to empty the breasts if your baby is sleepy or not nursing well. You may also want to pump if you are returning to work or or you feel you are getting engorged.   Avoid bottle feeds, pacifiers or supplemental feedings of water or juice in place of breastfeeding.   Be sure the baby is latched on and positioned properly while breastfeeding.   Prevent fatigue, stress, and anemia.   Wear a supportive bra, avoiding underwire styles.   Eat a balanced diet with enough fluids.  If you follow these suggestions, your engorgement should improve in 24 to 48 hours. If you are still experiencing difficulty, call your lactation consultant or caregiver. IS MY BABY GETTING ENOUGH MILK? Sometimes, mothers worry about whether their babies are getting enough milk. You can be assured that your baby is getting enough milk if:  The baby is actively sucking and you hear swallowing.    The baby nurses at least 8 to 12 times in a 24 hour time period. Nurse your baby until he or she unlatches or falls asleep at the first breast (at least 10 to 20 minutes), then offer the second side.   The baby is wetting 5 to 6 disposable diapers (6 to 8 cloth diapers) in a 24 hour period by 70 to 91 days of age.   The baby is having at least 2 to 3 stools every 24 hours for the first few months. Breast milk is all the food your baby needs. It is not necessary for your baby to have water or formula. In fact, to help your breasts make more milk, it is best not to give your baby supplemental feedings during the early weeks.   The stool should be soft and yellow.   The baby should gain 4 to 7 ounces per week after he is 72 days old.  TAKE CARE OF YOURSELF Take care of your breasts by:  Bathing or showering daily.   Avoiding the use of soaps on your nipples.   Start feedings on your left breast at one feeding and on your right breast at the next feeding.   You will notice an increase in your milk supply 2 to 5 days after delivery. You may feel some discomfort from engorgement, which makes your breasts very firm and often tender. Engorgement "peaks" out within 24 to 48 hours. In the meantime, apply warm moist towels to your breasts for 5 to 10 minutes before feeding. Gentle massage and expression of some milk before feeding will soften your breasts, making it easier for your baby to latch on. Wear a well fitting nursing bra and air dry your nipples for 10 to 15 minutes after each feeding.   Only use cotton bra pads.   Only use pure lanolin on your nipples after nursing. You do not need to wash it off before nursing.  Take care of yourself by:   Eating well-balanced meals and nutritious snacks.   Drinking milk, fruit juice, and water to satisfy your thirst (about 8 glasses a day).   Getting plenty of rest.   Increasing calcium in your diet (1200 mg a day).   Avoiding foods that you  notice affect the baby in a bad way.  SEEK MEDICAL CARE IF:   You have any questions or difficulty with breastfeeding.   You need help.   You have  a hard, red, sore area on your breast, accompanied by a fever of 100.5 F (38.1 C) or more.   Your baby is too sleepy to eat well or is having trouble sleeping.   Your baby is wetting less than 6 diapers per day, by 70 days of age.   Your baby's skin or white part of his or her eyes is more yellow than it was in the hospital.   You feel depressed.  Document Released: 10/08/2005 Document Revised: 09/27/2011 Document Reviewed: 05/23/2009 [facility] Patient Information 2012 [place], [place].

## 2012-01-17 ENCOUNTER — Other Ambulatory Visit: Payer: Medicaid Other

## 2012-01-18 LAB — GLUCOSE TOLERANCE, 1 HOUR: Glucose, 1 Hour GTT: 109 mg/dL (ref 70–140)

## 2012-01-18 LAB — HEMOGLOBINOPATHY EVALUATION
Hemoglobin Other: 0 %
Hgb A: 59 % — ABNORMAL LOW (ref 96.8–97.8)

## 2012-01-21 ENCOUNTER — Encounter: Payer: Self-pay | Admitting: Advanced Practice Midwife

## 2012-01-21 DIAGNOSIS — D573 Sickle-cell trait: Secondary | ICD-10-CM | POA: Insufficient documentation

## 2012-01-21 DIAGNOSIS — O99820 Streptococcus B carrier state complicating pregnancy: Secondary | ICD-10-CM | POA: Insufficient documentation

## 2012-01-21 DIAGNOSIS — O34219 Maternal care for unspecified type scar from previous cesarean delivery: Secondary | ICD-10-CM | POA: Insufficient documentation

## 2012-01-21 DIAGNOSIS — O0930 Supervision of pregnancy with insufficient antenatal care, unspecified trimester: Secondary | ICD-10-CM | POA: Insufficient documentation

## 2012-01-21 NOTE — Progress Notes (Signed)
Baby active on exam. FKCs reviewed. GBS at NV. Denies HA, vision changes or epigastric pain. Watch BPs.

## 2012-01-22 LAB — CULTURE, BETA STREP (GROUP B ONLY)

## 2012-01-23 ENCOUNTER — Ambulatory Visit (INDEPENDENT_AMBULATORY_CARE_PROVIDER_SITE_OTHER): Payer: Medicaid Other | Admitting: Physician Assistant

## 2012-01-23 VITALS — BP 119/82 | Temp 98.6°F | Wt 187.0 lb

## 2012-01-23 DIAGNOSIS — O0930 Supervision of pregnancy with insufficient antenatal care, unspecified trimester: Secondary | ICD-10-CM

## 2012-01-23 DIAGNOSIS — Z23 Encounter for immunization: Secondary | ICD-10-CM

## 2012-01-23 DIAGNOSIS — O34219 Maternal care for unspecified type scar from previous cesarean delivery: Secondary | ICD-10-CM

## 2012-01-23 LAB — POCT URINALYSIS DIP (DEVICE)
Ketones, ur: NEGATIVE mg/dL
Protein, ur: NEGATIVE mg/dL
Specific Gravity, Urine: 1.015 (ref 1.005–1.030)

## 2012-01-23 MED ORDER — TETANUS-DIPHTH-ACELL PERTUSSIS 5-2.5-18.5 LF-MCG/0.5 IM SUSP
0.5000 mL | Freq: Once | INTRAMUSCULAR | Status: AC
  Administered 2012-01-23: 0.5 mL via INTRAMUSCULAR

## 2012-01-23 NOTE — Progress Notes (Signed)
No complaints. Reviewed positive GBS. Desires epidural in labor, Brstfeeding discussed, plans IUD pp. Labor precautions

## 2012-01-23 NOTE — Progress Notes (Signed)
Pulse-98   Pressure- right side

## 2012-01-23 NOTE — Patient Instructions (Signed)

## 2012-01-25 ENCOUNTER — Inpatient Hospital Stay (HOSPITAL_COMMUNITY)
Admission: AD | Admit: 2012-01-25 | Discharge: 2012-01-27 | DRG: 775 | Disposition: A | Discharge status: Home / Self Care | Payer: Medicaid Other | Source: Ambulatory Visit | Attending: Obstetrics & Gynecology | Admitting: Obstetrics & Gynecology

## 2012-01-25 ENCOUNTER — Encounter (HOSPITAL_COMMUNITY): Payer: Self-pay | Admitting: *Deleted

## 2012-01-25 ENCOUNTER — Inpatient Hospital Stay (HOSPITAL_COMMUNITY): Payer: Medicaid Other | Admitting: Anesthesiology

## 2012-01-25 ENCOUNTER — Encounter (HOSPITAL_COMMUNITY): Payer: Self-pay | Admitting: Anesthesiology

## 2012-01-25 DIAGNOSIS — IMO0001 Reserved for inherently not codable concepts without codable children: Secondary | ICD-10-CM

## 2012-01-25 DIAGNOSIS — O99820 Streptococcus B carrier state complicating pregnancy: Secondary | ICD-10-CM

## 2012-01-25 DIAGNOSIS — O0930 Supervision of pregnancy with insufficient antenatal care, unspecified trimester: Secondary | ICD-10-CM

## 2012-01-25 DIAGNOSIS — H5501 Congenital nystagmus: Secondary | ICD-10-CM | POA: Diagnosis present

## 2012-01-25 DIAGNOSIS — O9989 Other specified diseases and conditions complicating pregnancy, childbirth and the puerperium: Secondary | ICD-10-CM

## 2012-01-25 DIAGNOSIS — Z22330 Carrier of Group B streptococcus: Secondary | ICD-10-CM

## 2012-01-25 DIAGNOSIS — O99892 Other specified diseases and conditions complicating childbirth: Secondary | ICD-10-CM | POA: Diagnosis present

## 2012-01-25 DIAGNOSIS — O34219 Maternal care for unspecified type scar from previous cesarean delivery: Secondary | ICD-10-CM

## 2012-01-25 LAB — CBC
HCT: 29.9 % — ABNORMAL LOW (ref 36.0–46.0)
Hemoglobin: 9.7 g/dL — ABNORMAL LOW (ref 12.0–15.0)
MCV: 81.3 fL (ref 78.0–100.0)
RDW: 13.7 % (ref 11.5–15.5)
WBC: 7.9 10*3/uL (ref 4.0–10.5)

## 2012-01-25 MED ORDER — LACTATED RINGERS IV SOLN
500.0000 mL | INTRAVENOUS | Status: DC | PRN
  Administered 2012-01-25: 1000 mL via INTRAVENOUS

## 2012-01-25 MED ORDER — PHENYLEPHRINE 40 MCG/ML (10ML) SYRINGE FOR IV PUSH (FOR BLOOD PRESSURE SUPPORT)
80.0000 ug | PREFILLED_SYRINGE | INTRAVENOUS | Status: DC | PRN
  Filled 2012-01-25: qty 5

## 2012-01-25 MED ORDER — ONDANSETRON HCL 4 MG/2ML IJ SOLN
4.0000 mg | Freq: Four times a day (QID) | INTRAMUSCULAR | Status: DC | PRN
  Administered 2012-01-25: 4 mg via INTRAVENOUS
  Filled 2012-01-25: qty 2

## 2012-01-25 MED ORDER — OXYTOCIN 20 UNITS IN LACTATED RINGERS INFUSION - SIMPLE
125.0000 mL/h | Freq: Once | INTRAVENOUS | Status: AC
  Administered 2012-01-25: 500 mL/h via INTRAVENOUS

## 2012-01-25 MED ORDER — OXYTOCIN BOLUS FROM INFUSION
500.0000 mL | Freq: Once | INTRAVENOUS | Status: DC
  Filled 2012-01-25: qty 500
  Filled 2012-01-25: qty 1000

## 2012-01-25 MED ORDER — OXYCODONE-ACETAMINOPHEN 5-325 MG PO TABS: 1.0000 | ORAL_TABLET | ORAL | Status: DC | PRN

## 2012-01-25 MED ORDER — HYDROXYZINE HCL 50 MG PO TABS: 50.0000 mg | ORAL_TABLET | Freq: Four times a day (QID) | ORAL | Status: DC | PRN

## 2012-01-25 MED ORDER — PENICILLIN G POTASSIUM 5000000 UNITS IJ SOLR
5.0000 10*6.[IU] | Freq: Once | INTRAVENOUS | Status: AC
  Administered 2012-01-25: 5 10*6.[IU] via INTRAVENOUS
  Filled 2012-01-25: qty 5

## 2012-01-25 MED ORDER — ACETAMINOPHEN 325 MG PO TABS: 650.0000 mg | ORAL_TABLET | ORAL | Status: DC | PRN

## 2012-01-25 MED ORDER — EPHEDRINE 5 MG/ML INJ: 10.0000 mg | INTRAVENOUS | Status: DC | PRN

## 2012-01-25 MED ORDER — PENICILLIN G POTASSIUM 5000000 UNITS IJ SOLR: 2.5000 10*6.[IU] | INTRAVENOUS | Status: DC

## 2012-01-25 MED ORDER — EPHEDRINE 5 MG/ML INJ
10.0000 mg | INTRAVENOUS | Status: DC | PRN
  Filled 2012-01-25: qty 4

## 2012-01-25 MED ORDER — CITRIC ACID-SODIUM CITRATE 334-500 MG/5ML PO SOLN: 30.0000 mL | ORAL | Status: DC | PRN

## 2012-01-25 MED ORDER — IBUPROFEN 600 MG PO TABS
600.0000 mg | ORAL_TABLET | Freq: Four times a day (QID) | ORAL | Status: DC | PRN
  Administered 2012-01-25: 600 mg via ORAL
  Filled 2012-01-25: qty 1

## 2012-01-25 MED ORDER — SODIUM CHLORIDE 0.9 % IV SOLN: 2.0000 g | Freq: Once | INTRAVENOUS | Status: DC

## 2012-01-25 MED ORDER — PHENYLEPHRINE 40 MCG/ML (10ML) SYRINGE FOR IV PUSH (FOR BLOOD PRESSURE SUPPORT): 80.0000 ug | PREFILLED_SYRINGE | INTRAVENOUS | Status: DC | PRN

## 2012-01-25 MED ORDER — FENTANYL 2.5 MCG/ML BUPIVACAINE 1/10 % EPIDURAL INFUSION (WH - ANES)
14.0000 mL/h | INTRAMUSCULAR | Status: DC
  Administered 2012-01-25: 14 mL/h via EPIDURAL
  Filled 2012-01-25 (×2): qty 60

## 2012-01-25 MED ORDER — PENICILLIN G POTASSIUM 5000000 UNITS IJ SOLR
2.5000 10*6.[IU] | INTRAVENOUS | Status: DC
  Administered 2012-01-25: 2.5 10*6.[IU] via INTRAVENOUS
  Filled 2012-01-25 (×6): qty 2.5

## 2012-01-25 MED ORDER — PENICILLIN G POTASSIUM 5000000 UNITS IJ SOLR: 5.0000 10*6.[IU] | Freq: Once | INTRAVENOUS | Status: DC

## 2012-01-25 MED ORDER — LIDOCAINE HCL (PF) 1 % IJ SOLN
30.0000 mL | INTRAMUSCULAR | Status: DC | PRN
  Filled 2012-01-25: qty 30

## 2012-01-25 MED ORDER — LACTATED RINGERS IV SOLN: 500.0000 mL | Freq: Once | INTRAVENOUS | Status: DC

## 2012-01-25 MED ORDER — SODIUM BICARBONATE 8.4 % IV SOLN
INTRAVENOUS | Status: DC | PRN
  Administered 2012-01-25: 4 mL via EPIDURAL

## 2012-01-25 MED ORDER — DIPHENHYDRAMINE HCL 50 MG/ML IJ SOLN: 12.5000 mg | INTRAMUSCULAR | Status: DC | PRN

## 2012-01-25 MED ORDER — FLEET ENEMA 7-19 GM/118ML RE ENEM: 1.0000 | ENEMA | RECTAL | Status: DC | PRN

## 2012-01-25 MED ORDER — DIPHENHYDRAMINE HCL 50 MG/ML IJ SOLN: 50.0000 mg | Freq: Once | INTRAMUSCULAR | Status: DC

## 2012-01-25 MED ORDER — LACTATED RINGERS IV SOLN
INTRAVENOUS | Status: DC
  Administered 2012-01-25: 18:00:00 via INTRAVENOUS

## 2012-01-25 MED ORDER — HYDROXYZINE HCL 50 MG/ML IM SOLN
50.0000 mg | Freq: Four times a day (QID) | INTRAMUSCULAR | Status: DC | PRN
  Filled 2012-01-25: qty 1

## 2012-01-25 MED ORDER — FENTANYL 2.5 MCG/ML BUPIVACAINE 1/10 % EPIDURAL INFUSION (WH - ANES)
INTRAMUSCULAR | Status: DC | PRN
  Administered 2012-01-25: 14 mL/h via EPIDURAL

## 2012-01-25 MED ORDER — NALBUPHINE SYRINGE 5 MG/0.5 ML: 5.0000 mg | INJECTION | INTRAMUSCULAR | Status: DC | PRN

## 2012-01-25 NOTE — Progress Notes (Signed)
Cristela Blue MD anesthesia called and informed of pt status.  Epidural level WNL.  VSS.  Per MD it doesn't sound like an epidural issue.  No orders at this time.

## 2012-01-25 NOTE — Progress Notes (Signed)
Theresa Schroeder is a 26 y.o. G3P1011 at [redacted]w[redacted]d   Subjective: Doing well. No complaints at this time.   Objective: BP 120/78  Pulse 90  Temp(Src) 97.3 F (36.3 C) (Oral)  Resp 18  Ht 5' 3.75" (1.619 m)  Wt 85.095 kg (187 lb 9.6 oz)  BMI 32.45 kg/m2  SpO2 99%  LMP 04/30/2011  Breastfeeding? Unknown      FHT:  FHR: 140s bpm, variability: moderate,  accelerations:  Present,  decelerations:  Absent SVE:   Dilation: 5 Effacement (%): 50 Station: -1 Exam by: Shelly Flatten, MD   Labs: Lab Results  Component Value Date   WBC 7.9 01/25/2012   HGB 9.7* 01/25/2012   HCT 29.9* 01/25/2012   MCV 81.3 01/25/2012   PLT 222 01/25/2012    Assessment / Plan: Spontaneous labor, progressing normally Previous episode of difficulty breathing was likely due to anxiety. No signs of allergic reaction. Labor: Progressing normally Fetal Wellbeing:  Category I Pain Control:  Epidural I/D:  PCN starting at 14:45 Anticipated MOD:  NSVD  Theresa Schroeder 01/25/2012, 6:03 PM

## 2012-01-25 NOTE — Progress Notes (Signed)
Pt vomitting,  Voiding a large amt of urine in  The bed

## 2012-01-25 NOTE — Anesthesia Procedure Notes (Signed)

## 2012-01-25 NOTE — MAU Note (Signed)
Patient states she she has been having abdominal cramping about every 5 minutes. Hurts when turning over from side to side. Has a slight increase in clear discharge. No bleeding and reports good fetal movement. Patient had a cesarean delivery with her first pregnancy, plans a TOLAC with this pregnancy.

## 2012-01-25 NOTE — Progress Notes (Signed)
Pt with no support person in the room at this time, Continued to talk to pt and reassure her that all looks good.  Pt starting to settle down.  Pt stating that she started feeling scared after she couldn't move and turn on her side.  Pt reassurred again that is WNL with epidural.

## 2012-01-25 NOTE — Progress Notes (Signed)
Patient ID: Theresa Schroeder, female   DOB: 1986/03/20, 26 y.o.   MRN: [id number]  Feeling urge to push  FHR reassuring with small accels UCs every 2-3 min.  Dilation: 10 Dilation Complete Date: 01/25/12 Dilation Complete Time: 2000 Effacement (%): 100 Cervical Position: Middle Station: +2 Presentation: Vertex Exam by:: Katrinka Blazing, CNM  Will start pushing.

## 2012-01-25 NOTE — Progress Notes (Signed)
RN in room, pt sweating and crying and c/o SOB.  Pt breathing fast and seemingly very upset.  VSS.  Pulse ox indicating maternal tachycardia at times.  Pt instructed to slow her breathing down.  Pt also c/o right leg as tingling, and hard to move.  Offered pt reassurrance that that was normal for epidural.   Pt also c/o nausea.  Pt not noted to have any swelling in the tongue or rash noted.  PCN infusing, stopped.  Dorathy Kinsman CNM called and informed of pt status, orders for Benadryl to be placed in computer.

## 2012-01-25 NOTE — Progress Notes (Signed)
I agree with above.  Theresa Schroeder 01/25/2012 6:17 PM

## 2012-01-25 NOTE — Anesthesia Preprocedure Evaluation (Signed)

## 2012-01-25 NOTE — H&P (Signed)
Theresa Schroeder is a 26 y.o. year old G85P1011 female at [redacted]w[redacted]d weeks gestation by  Third trimester [place] (38.5 by LMP) who presents to MAU reporting Spontaneous rupture of membranes and Labor.  Maternal Medical History:  Reason for admission: Reason for admission: rupture of membranes and contractions.  Contractions: Frequency: regular.    Fetal activity: Perceived fetal activity is normal.   Last perceived fetal movement was within the past hour.    Prenatal Complications - Diabetes: none.    Patient Active Problem List  Diagnoses  . Supervision of normal pregnancy  . Sickle cell trait  . Previous cesarean delivery affecting pregnancy, antepartum  . Insufficient prenatal care  . Group B Streptococcus carrier, antepartum    OB History    Grav Para Term Preterm Abortions TAB SAB Ect Mult Living   3 1 1  0 1 1 0 0 0 1     Past Medical History  Diagnosis Date  . Nystagmus   . Anemia     sickle cell anemia   Past Surgical History  Procedure Date  . Cesarean section   . Cataract extraction    Family History: family history is negative for Anesthesia problems. Social History:  reports that she has never smoked. She has never used smokeless tobacco. She reports that she does not drink alcohol or use illicit drugs.  Review of Systems  Constitutional: Negative for fever and chills.  Eyes: Negative for blurred vision.  Neurological: Negative for headaches.    Dilation: 3.5 Effacement (%): 70 Station: -1 Exam by:: K.WIlson,RN Blood pressure 130/86, pulse 107, temperature 97.6 F (36.4 C), temperature source Oral, resp. rate 16, height 5' 3.75" (1.619 m), weight 85.095 kg (187 lb 9.6 oz), last menstrual period 04/30/2011, SpO2 100.00%, unknown if currently breastfeeding. Maternal Exam:  Abdomen: Patient reports no abdominal tenderness. Surgical scars: low transverse.   Cervix: Cervix evaluated by digital exam.     Physical Exam  Nursing note and vitals  reviewed. Constitutional: She appears well-developed and well-nourished.    Prenatal labs: ABO, Rh: --/--/O POS (03/11 1226) Antibody: NEG (03/11 1226) Rubella: 32.1 (03/11 1225) RPR: NON REACTIVE (03/11 1225)  HBsAg: NEGATIVE (03/11 1225)  HIV: NON REACTIVE (03/11 1225)  GBS:   Pos Too late for genetic screening  Assessment: 1. Labor: early, SROM clear 2. Fetal Wellbeing: Category I  3. Pain Control: undecided 4. GBS: pos 5. 36.5 week IUP  Plan:  1. Admit to BS per consult with MD 2. Routine L&D orders 3. Analgesia/anesthesia PRN  4. PCN  Katrinka Blazing, Talvin Christianson 01/25/2012 1:48 PM

## 2012-01-25 NOTE — Progress Notes (Signed)
Notified of SROM and SVE. Admit orders given

## 2012-01-26 MED ORDER — DIPHENHYDRAMINE HCL 25 MG PO CAPS: 25.0000 mg | ORAL_CAPSULE | Freq: Four times a day (QID) | ORAL | Status: DC | PRN

## 2012-01-26 MED ORDER — IBUPROFEN 600 MG PO TABS
600.0000 mg | ORAL_TABLET | Freq: Four times a day (QID) | ORAL | Status: DC
  Administered 2012-01-26 – 2012-01-27 (×6): 600 mg via ORAL
  Filled 2012-01-26 (×6): qty 1

## 2012-01-26 MED ORDER — SENNOSIDES-DOCUSATE SODIUM 8.6-50 MG PO TABS
2.0000 | ORAL_TABLET | Freq: Every day | ORAL | Status: DC
Start: 2012-01-26 — End: 2012-01-27
  Administered 2012-01-26: 2 via ORAL

## 2012-01-26 MED ORDER — BENZOCAINE-MENTHOL 20-0.5 % EX AERO: 1.0000 "application " | INHALATION_SPRAY | CUTANEOUS | Status: DC | PRN

## 2012-01-26 MED ORDER — WITCH HAZEL-GLYCERIN EX PADS: 1.0000 "application " | MEDICATED_PAD | CUTANEOUS | Status: DC | PRN

## 2012-01-26 MED ORDER — ONDANSETRON HCL 4 MG PO TABS: 4.0000 mg | ORAL_TABLET | ORAL | Status: DC | PRN

## 2012-01-26 MED ORDER — PRENATAL MULTIVITAMIN CH
1.0000 | ORAL_TABLET | Freq: Every day | ORAL | Status: DC
  Administered 2012-01-26 – 2012-01-27 (×2): 1 via ORAL
  Filled 2012-01-26 (×2): qty 1

## 2012-01-26 MED ORDER — TETANUS-DIPHTH-ACELL PERTUSSIS 5-2.5-18.5 LF-MCG/0.5 IM SUSP: 0.5000 mL | Freq: Once | INTRAMUSCULAR | Status: DC

## 2012-01-26 MED ORDER — DIBUCAINE 1 % RE OINT: 1.0000 "application " | TOPICAL_OINTMENT | RECTAL | Status: DC | PRN

## 2012-01-26 MED ORDER — SIMETHICONE 80 MG PO CHEW: 80.0000 mg | CHEWABLE_TABLET | ORAL | Status: DC | PRN

## 2012-01-26 MED ORDER — LANOLIN HYDROUS EX OINT: TOPICAL_OINTMENT | CUTANEOUS | Status: DC | PRN

## 2012-01-26 MED ORDER — OXYCODONE-ACETAMINOPHEN 5-325 MG PO TABS
1.0000 | ORAL_TABLET | ORAL | Status: DC | PRN
  Administered 2012-01-26 – 2012-01-27 (×5): 1 via ORAL
  Filled 2012-01-26 (×5): qty 1

## 2012-01-26 MED ORDER — ZOLPIDEM TARTRATE 5 MG PO TABS: 5.0000 mg | ORAL_TABLET | Freq: Every evening | ORAL | Status: DC | PRN

## 2012-01-26 MED ORDER — ONDANSETRON HCL 4 MG/2ML IJ SOLN: 4.0000 mg | INTRAMUSCULAR | Status: DC | PRN

## 2012-01-26 NOTE — Anesthesia Postprocedure Evaluation (Signed)
  Anesthesia Post-op Note  Patient: Theresa Schroeder  Procedure(s) Performed: * No procedures listed *  Patient Location: Mother/Baby  Anesthesia Type: Epidural  Level of Consciousness: awake, alert  and oriented  Airway and Oxygen Therapy: Patient Spontanous Breathing  Post-op Pain: mild  Post-op Assessment: Patient's Cardiovascular Status Stable, Respiratory Function Stable, Patent Airway, No signs of Nausea or vomiting and Pain level controlled  Post-op Vital Signs: stable  Complications: No apparent anesthesia complications

## 2012-01-26 NOTE — Progress Notes (Signed)
Post Partum Day 1 Subjective: no complaints, up ad lib, voiding and tolerating PO; breast and bottlefeeding; desires IUD for birth control.    Objective: Blood pressure 129/88, pulse 98, temperature 97.5 F (36.4 C), temperature source Oral, resp. rate 18, height 5' 3.75" (1.619 m), weight 85.095 kg (187 lb 9.6 oz), last menstrual period 04/30/2011, SpO2 99.00%, unknown if currently breastfeeding.  Physical Exam:  General: alert, cooperative and appears stated age Lochia: appropriate Uterine Fundus: firm Incision: n/a DVT Evaluation: No evidence of DVT seen on physical exam. Negative Homan's sign.   Basename 01/25/12 1343  HGB 9.7*  HCT 29.9*    Assessment/Plan: Plan for discharge tomorrow and Breastfeeding   LOS: 1 day   [facility] 01/26/2012, 8:49 AM

## 2012-01-27 MED ORDER — IBUPROFEN 600 MG PO TABS: 600.0000 mg | ORAL_TABLET | Freq: Four times a day (QID) | ORAL | Status: AC

## 2012-01-27 MED ORDER — ACETAMINOPHEN-CODEINE 300-30 MG PO TABS: 1.0000 | ORAL_TABLET | ORAL | Status: AC | PRN

## 2012-01-27 NOTE — Discharge Summary (Signed)
Obstetric Discharge Summary Reason for Admission: onset of labor and rupture of membranes Prenatal Procedures: NST and ultrasound Intrapartum Procedures: Vaginal Birth After C-Section Postpartum Procedures: none Complications-Operative and Postpartum: 2nd degree perineal laceration Hemoglobin  Date Value Range Status  01/25/2012 9.7* 12.0-15.0 (g/dL) Final     HCT  Date Value Range Status  01/25/2012 29.9* 36.0-46.0 (%) Final    Physical Exam:  General: alert, cooperative, appears stated age and no distress Lochia: appropriate Uterine Fundus: firm Incision: n/a DVT Evaluation: No evidence of DVT seen on physical exam. Negative Homan's sign.  Discharge Diagnoses: Preterm Delivery  Discharge Information: Date: 01/27/2012 Activity: pelvic rest Diet: routine Medications: PNV, Tylenol #3, Ibuprofen and Iron Condition: stable Instructions: refer to practice specific booklet Discharge to: home Follow-up Information    [facility] Schedule an appointment as soon as possible for a visit in 6 weeks.         Newborn Data: Live born female  Birth Weight: 6 lb 8.8 oz (2970 g) APGAR: 9, 9  Home with mother.  [facility] 01/27/2012, 7:35 AM

## 2012-01-27 NOTE — Discharge Summary (Signed)
Attestation of Attending Supervision of Advanced Practitioner: Evaluation and management procedures were performed by the [facility] Fellow/PA/CNM/NP under my supervision and collaboration. Chart reviewed, and agree with management and plan.  Jaynie Collins, M.D. 01/27/2012 8:23 AM

## 2012-01-27 NOTE — Progress Notes (Signed)
PSYCHOSOCIAL ASSESSMENT ~ MATERNAL/CHILD  Name: Theresa Schroeder  Age: 24 day   Referral Date: 01/26/2012 Reason/Source: Late prenatal care  I. FAMILY/HOME ENVIRONMENT  A. Child's Legal Guardian Parent(s)  Name: Theresa Schroeder  DOB: 17-Jan-1986 Age: 47  Address: [address] [place], [place] [address]   B. Family household members/Support Persons:   Maternal grandmother  Jesse Sans, age 71  C. Other support:  Maternal uncle, extended family :  II. PSYCHOSOCIAL DATA A. Information Source X Patient Interview  X family   B. Education administrator  [employer]  C. Cultural and Environment Information/Cultural Issues Impacting Care: N/A III. STRENGTHS X Supportive family/friends  X Adequate Resources  X Compliance with medical plan  X Home prepared for Child (including basic supplies)  X Understanding of illness  X Pediatrician: Dr. Chestine Spore  IV. RISK FACTORS AND CURRENT PROBLEMS  X No Problems Noted   V. SOCIAL WORK ASSESSMENT Met with MOB and baby at bedside.  Maternal grandmother and maternal uncle were present with MOB permission.  MOB lives with her 73 year old son in maternal grandmother's house.  Maternal grandmother has 2 other grandchildren and is happy for the newest addition to the family. MOB reports she was late to care due to taking a while to get Medicaid insurance set up.  She reported no problems in her pregnancy.  She is currently breastfeeding and reports things are going well.  She plans to take baby to same pediatrician as her 32 year old.  She was appropriate, engaging, and receptive to information discussed.  I advised against bedsharing due to 26 year old also bed shares and could increase risk of SIDS/roll over hazard.  Maternal grandmother stated they planned to get a bassinet today for baby.  I discussed normal newborn behavior, normal infant feeding for breastfed babies, and the importance of on-demand feeds, and  skin-to-skin contact.  I discussed the package incentives from [facility] the more breastfeeding she does and we talked about infant comfort measures.  MOB had no concerns during visit, and reported good supports.       VI. SOCIAL WORK PLAN X No Further Intervention Required/No Barriers to Discharge  X Patient/Family Education: Breastfeeding, newborn care/comfort measures  Staci Acosta, LCSW 7:36 pm

## 2012-01-28 NOTE — Progress Notes (Signed)
Post discharge chart review completed.  

## 2012-02-22 ENCOUNTER — Encounter: Payer: Self-pay | Admitting: Family Medicine

## 2012-02-22 ENCOUNTER — Ambulatory Visit (INDEPENDENT_AMBULATORY_CARE_PROVIDER_SITE_OTHER): Payer: Medicaid Other | Admitting: Family Medicine

## 2012-02-22 LAB — POCT PREGNANCY, URINE: Preg Test, Ur: NEGATIVE

## 2012-02-22 NOTE — Patient Instructions (Signed)
Intrauterine Device Insertion Most often, an intrauterine device (IUD) is inserted into the uterus to prevent pregnancy. There are 2 types of IUDs available:  Copper IUD. This type of IUD creates an environment that is not favorable to sperm survival. The mechanism of action of the copper IUD is not known for certain. It can stay in place for 10 years.   Hormone IUD. This type of IUD contains the hormone progestin (synthetic progesterone). The progestin thickens the cervical mucus and prevents sperm from entering the uterus, and it also thins the uterine lining. There is no evidence that the hormone IUD prevents implantation. The hormone IUD can stay in place for up to 5 years.  An IUD is the most cost-effective birth control if left in place for the full duration. It may be removed at any time. LET YOUR CAREGIVER KNOW ABOUT:  Sensitivity to metals.   Medicines taken including herbs, eyedrops, over-the-counter medicines, and creams.   Use of steroids (by mouth or creams).   Previous problems with anesthetics or numbing medicine.   Previous gynecological surgery.   History of blood clots or clotting disorders.   Possibility of pregnancy.   Menstrual irregularities.   Concerns regarding unusual vaginal discharge or odors.   Previous experience with an IUD.   Other health problems.  RISKS AND COMPLICATIONS  Accidental puncture (perforation) of the uterus.   Accidental placement of the IUD either in the muscle layer of the uterus (myometrium) or outside the uterus. If this happen, the IUD can be found essentially floating around the bowels. When this happens, the IUD must be taken out surgically.   The IUD may fall out of the uterus (expulsion). This is more common in women who have recently had a child.    Pregnancy in the fallopian tube (ectopic).  BEFORE THE PROCEDURE  Schedule the IUD insertion for when you will have your menstrual period or right after, to make sure you  are not pregnant. Placement of the IUD is better tolerated shortly after a menstrual cycle.   You may need to take tests or be examined to make sure you are not pregnant.   You may be required to take a pregnancy test.   You may be required to get checked for sexually transmitted infections (STIs) prior to placement. Placing an IUD in someone who has an infection can make an infection worse.   You may be given a pain reliever to take 1 or 2 hours before the procedure.   An exam will be performed to determine the size and position of your uterus.   Ask your caregiver about changing or stopping your regular medicines.  PROCEDURE   A tool (speculum) is placed in the vagina. This allows your caregiver to see the lower part of the uterus (cervix).   The cervix is prepped with a medicine that lowers the risk of infection.   You may be given a medicine to numb each side of the cervix (intracervical or paracervical block). This is used to block and control any discomfort with insertion.   A tool (uterine sound) is inserted into the uterus to determine the length of the uterine cavity and the direction the uterus may be tilted.   A slim instrument (IUD inserter) is inserted through the cervical canal and into your uterus.   The IUD is placed in the uterine cavity and the insertion device is removed.   The nylon string that is attached to the IUD, and used for   eventual IUD removal, is trimmed. It is trimmed so that it lays high in the vagina, just outside the cervix.  AFTER THE PROCEDURE  You may have bleeding after the procedure. This is normal. It varies from light spotting for a few days to menstrual-like bleeding.   You may have mild cramping.   Practice checking the string coming out of the cervix to make sure the IUD remains in the uterus. If you cannot feel the string, you should schedule a "string check" with your caregiver.   If you had a hormone IUD inserted, expect that your  period may be lighter or nonexistent within a year's time (though this is not always the case). There may be delayed fertility with the hormone IUD as a result of its progesterone effect. When you are ready to become pregnant, it is suggested to have the IUD removed up to 1 year in advance.   Yearly exams are advised.  Document Released: 06/06/2011 Document Revised: 09/27/2011 Document Reviewed: 06/06/2011 Exit[employer] Patient Information 2012 [employer]. 

## 2012-02-22 NOTE — Progress Notes (Signed)
  Subjective:     Theresa Schroeder is a 26 y.o. female who presents for a postpartum visit. She is 6 weeks postpartum following a VBAC. I have fully reviewed the prenatal and intrapartum course. The delivery was at 36.5 gestational weeks. Outcome: vaginal birth after cesarean (VBAC). Anesthesia: epidural. Postpartum course has been normal. Baby's course has been normal. Baby is feeding by both breast and bottle - unsure. Bleeding no bleeding. Bowel function is normal. Bladder function is normal. Patient is not sexually active. Contraception method is IUD. Postpartum depression screening: negative.  The following portions of the patient's history were reviewed and updated as appropriate: allergies, current medications, past family history, past medical history, past social history, past surgical history and problem list.  Review of Systems Pertinent items are noted in HPI.   Objective:    BP 130/83  Pulse 87  Temp 98.3 F (36.8 C)  Ht 5\' 3"  (1.6 m)  Wt 175 lb 3.2 oz (79.47 kg)  BMI 31.04 kg/m2  Breastfeeding? Yes  General:  alert, cooperative and no distress     Lungs: clear to auscultation bilaterally  Heart:  regular rate and rhythm, S1, S2 normal, no murmur, click, rub or gallop  Abdomen: soft, non-tender; bowel sounds normal; no masses,  no organomegaly        Assessment:    Normal postpartum exam. Pap smear not done at today's visit.   Plan:    1. Contraception: IUD 2. IUD insertion in 2 weeks.  Negative UPT today.  Counseled patient that needs to have protected intercourse until IUD placed. 3. Follow up in: 2 weeks or as needed.

## 2012-03-12 ENCOUNTER — Ambulatory Visit (INDEPENDENT_AMBULATORY_CARE_PROVIDER_SITE_OTHER): Payer: Medicaid Other | Admitting: Advanced Practice Midwife

## 2012-03-12 ENCOUNTER — Encounter: Payer: Self-pay | Admitting: Advanced Practice Midwife

## 2012-03-12 VITALS — BP 126/84 | HR 87 | Temp 98.6°F | Resp 20 | Ht 63.0 in

## 2012-03-12 DIAGNOSIS — Z309 Encounter for contraceptive management, unspecified: Secondary | ICD-10-CM

## 2012-03-12 DIAGNOSIS — Z30430 Encounter for insertion of intrauterine contraceptive device: Secondary | ICD-10-CM

## 2012-03-12 DIAGNOSIS — Z01812 Encounter for preprocedural laboratory examination: Secondary | ICD-10-CM

## 2012-03-12 LAB — POCT PREGNANCY, URINE: Preg Test, Ur: NEGATIVE

## 2012-03-12 NOTE — Progress Notes (Signed)
UPT:negative

## 2012-03-12 NOTE — Patient Instructions (Signed)
Contraception Choices Contraception (birth control) is the use of any methods or devices to prevent pregnancy. Below are some methods to help avoid pregnancy. HORMONAL METHODS   Contraceptive implant. This is a thin, plastic tube containing progesterone hormone. It does not contain estrogen hormone. Your caregiver inserts the tube in the inner part of the upper arm. The tube can remain in place for up to 3 years. After 3 years, the implant must be removed. The implant prevents the ovaries from releasing an egg (ovulation), thickens the cervical mucus which prevents sperm from entering the uterus, and thins the lining of the inside of the uterus.   Progesterone-only injections. These injections are given every 3 months by your caregiver to prevent pregnancy. This synthetic progesterone hormone stops the ovaries from releasing eggs. It also thickens cervical mucus and changes the uterine lining. This makes it harder for sperm to survive in the uterus.   Birth control pills. These pills contain estrogen and progesterone hormone. They work by stopping the egg from forming in the ovary (ovulation). Birth control pills are prescribed by a caregiver.Birth control pills can also be used to treat heavy periods.   Minipill. This type of birth control pill contains only the progesterone hormone. They are taken every day of each month and must be prescribed by your caregiver.   Birth control patch. The patch contains hormones similar to those in birth control pills. It must be changed once a week and is prescribed by a caregiver.   Vaginal ring. The ring contains hormones similar to those in birth control pills. It is left in the vagina for 3 weeks, removed for 1 week, and then a new one is put back in place. The patient must be comfortable inserting and removing the ring from the vagina.A caregiver's prescription is necessary.   Emergency contraception. Emergency contraceptives prevent pregnancy after  unprotected sexual intercourse. This pill can be taken right after sex or up to 5 days after unprotected sex. It is most effective the sooner you take the pills after having sexual intercourse. Emergency contraceptive pills are available without a prescription. Check with your pharmacist. Do not use emergency contraception as your only form of birth control.  BARRIER METHODS   Female condom. This is a thin sheath (latex or rubber) that is worn over the penis during sexual intercourse. It can be used with spermicide to increase effectiveness.   Female condom. This is a soft, loose-fitting sheath that is put into the vagina before sexual intercourse.   Diaphragm. This is a soft, latex, dome-shaped barrier that must be fitted by a caregiver. It is inserted into the vagina, along with a spermicidal jelly. It is inserted before intercourse. The diaphragm should be left in the vagina for 6 to 8 hours after intercourse.   Cervical cap. This is a round, soft, latex or plastic cup that fits over the cervix and must be fitted by a caregiver. The cap can be left in place for up to 48 hours after intercourse.   Sponge. This is a soft, circular piece of polyurethane foam. The sponge has spermicide in it. It is inserted into the vagina after wetting it and before sexual intercourse.   Spermicides. These are chemicals that kill or block sperm from entering the cervix and uterus. They come in the form of creams, jellies, suppositories, foam, or tablets. They do not require a prescription. They are inserted into the vagina with an applicator before having sexual intercourse. The process must be   repeated every time you have sexual intercourse.  INTRAUTERINE CONTRACEPTION  Intrauterine device (IUD). This is a T-shaped device that is put in a woman's uterus during a menstrual period to prevent pregnancy. There are 2 types:   Copper IUD. This type of IUD is wrapped in copper wire and is placed inside the uterus. Copper  makes the uterus and fallopian tubes produce a fluid that kills sperm. It can stay in place for 10 years.   Hormone IUD. This type of IUD contains the hormone progestin (synthetic progesterone). The hormone thickens the cervical mucus and prevents sperm from entering the uterus, and it also thins the uterine lining to prevent implantation of a fertilized egg. The hormone can weaken or kill the sperm that get into the uterus. It can stay in place for 5 years.  PERMANENT METHODS OF CONTRACEPTION  Female tubal ligation. This is when the woman's fallopian tubes are surgically sealed, tied, or blocked to prevent the egg from traveling to the uterus.   Female sterilization. This is when the female has the tubes that carry sperm tied off (vasectomy).This blocks sperm from entering the vagina during sexual intercourse. After the procedure, the man can still ejaculate fluid (semen).  NATURAL PLANNING METHODS  Natural family planning. This is not having sexual intercourse or using a barrier method (condom, diaphragm, cervical cap) on days the woman could become pregnant.   Calendar method. This is keeping track of the length of each menstrual cycle and identifying when you are fertile.   Ovulation method. This is avoiding sexual intercourse during ovulation.   Symptothermal method. This is avoiding sexual intercourse during ovulation, using a thermometer and ovulation symptoms.   Post-ovulation method. This is timing sexual intercourse after you have ovulated.  Regardless of which type or method of contraception you choose, it is important that you use condoms to protect against the transmission of sexually transmitted diseases (STDs). Talk with your caregiver about which form of contraception is most appropriate for you. Document Released: 10/08/2005 Document Revised: 09/27/2011 Document Reviewed: 02/14/2011 [employer] Patient Information 2012 [employer]. 

## 2012-03-12 NOTE — Progress Notes (Signed)
IUD Insertion Procedure Note  Pre-operative Diagnosis: undesired fertility                                              Desires Mirena IUD  Post-operative Diagnosis: same  Indications: contraception  Procedure Details  Urine pregnancy test was done  and result was Negative.  The risks (including infection, bleeding, pain, and uterine perforation) and benefits of the procedure were explained to the patient and Written informed consent was obtained.    Cervix cleansed with Betadine. Uterus Unable to be sounded due to obstruction at internal os.   IUD not able to be inserted. Dr Macon Large consulted and attempted to insert without success. . Patient tolerated procedure well.  IUD Information: .  Condition: Stable  Complications: None  Plan: Discussed difficulty with patient Discussed other contraceptive methods Pt wishes to proceed with Nexplanon.  Will schedule.  The patient was advised to call for any fever or for prolonged or severe pain or bleeding. She was advised to use OTC ibuprofen as needed for mild to moderate pain.   Attending Physician Documentation: I was present for or participated in the entire procedure, including opening and closing.

## 2012-04-21 ENCOUNTER — Ambulatory Visit (INDEPENDENT_AMBULATORY_CARE_PROVIDER_SITE_OTHER): Payer: Medicaid Other | Admitting: Obstetrics & Gynecology

## 2012-04-21 ENCOUNTER — Encounter: Payer: Self-pay | Admitting: Obstetrics & Gynecology

## 2012-04-21 VITALS — BP 117/82 | HR 91 | Temp 97.6°F | Resp 12 | Ht 63.0 in | Wt 183.1 lb

## 2012-04-21 DIAGNOSIS — Z30017 Encounter for initial prescription of implantable subdermal contraceptive: Secondary | ICD-10-CM | POA: Insufficient documentation

## 2012-04-21 LAB — POCT PREGNANCY, URINE: Preg Test, Ur: NEGATIVE

## 2012-04-21 MED ORDER — ETONOGESTREL 68 MG ~~LOC~~ IMPL
68.0000 mg | DRUG_IMPLANT | Freq: Once | SUBCUTANEOUS | Status: AC
  Administered 2012-04-21: 68 mg via SUBCUTANEOUS

## 2012-04-21 NOTE — Patient Instructions (Signed)
Contraceptive Implant Information A contraceptive implant is a plastic rod that is inserted under the skin. It is usually inserted under the skin of your upper arm. It continually releases small amounts of progestin (synthetic progesterone) into the bloodstream. This prevents an egg from being released from the ovary. It also thickens the cervical mucus to prevent sperm from entering the cervix, and it thins the uterine lining to prevent a fertilized egg from attaching to the uterus. They can be effective for up to 3 years. Implants do not provide protection against sexually transmitted diseases (STDs).  The procedure to insert an implant usually takes about 10 minutes. There may be minor bruising, swelling, and discomfort at the insertion site for a couple days. The implant begins to work within the first day. Other contraceptive protection should be used for 2 weeks. Follow up with your caregiver to get rechecked as directed. Your caregiver will make sure you are a good candidate for the contraceptive implant. Discuss with your caregiver the possible side effects of the implant ADVANTAGES  It prevents pregnancy for up to 3 years.   It is easily reversible.   It is convenient.   The progestins may protect against uterine and ovarian cancer.   It can be used when breastfeeding.   It can be used by women who cannot take estrogen.  DISADVANTAGES  You may have irregular or unplanned vaginal bleeding.   You may develop side effects, including headache, weight gain, acne, breast tenderness, or mood changes.   You may have tissue or nerve damage after insertion (rare).   It may be difficult and uncomfortable to remove.   Certain medications may interfere with the effectiveness of the implants.  REMOVAL OF IMPLANT The implant should be removed in 3 years or as directed by your caregiver. The implants effect wears off in a few hours after removal. Your ability to get pregnant (fertility) is  restored within a couple of weeks. New implants can be inserted as soon as the old ones are removed if desired. DO NOT GET THE IMPLANT IF:   You are pregnant.   You have a history of breast cancer, osteoporosis, blood clots, heart disease, diabetes, high blood pressure, liver disease, tumors, or stroke.    You have undiagnosed vaginal bleeding.   You have overly sensitive to certain parts of the implant.  Document Released: 09/27/2011 Document Reviewed: 09/25/2011 [facility] Patient Information 2012 [place], [place].

## 2012-04-21 NOTE — Progress Notes (Signed)
Patient ID: Theresa Schroeder, female   DOB: 01-01-1986, 26 y.o.   MRN: [id number] Patient's last menstrual period was 04/06/2012. She comes today to have Nexplanon inserted. We discussed this method of contraception and that it is meant to be effective for 3 years. I told her the procedure of insertion. We discussed the risk of local pain and swelling bleeding or bruising. Also discussed the risk of menstrual irregularities. She voiced understanding. She signed consent for the procedure and an adequate timeout was performed.  Patient indicated she wished to have the device placed in her left upper arm. The site at the left upper inner arm was prepped with Betadine. The site was marked. 1% lidocaine was infiltrated. Device was placed subdermally according to recommended procedure. Device was released in its presence was confirmed by me and the patient. She tolerated this well. Sterile dressing was applied. She was given precautions to report if she has increased pain and swelling sign of infection or bleeding. After this she should return for routine gynecologic care.  Dr. Scheryl Darter 04/21/2012 3:05 PM

## 2013-03-02 ENCOUNTER — Emergency Department (INDEPENDENT_AMBULATORY_CARE_PROVIDER_SITE_OTHER): Payer: Medicaid Other

## 2013-03-02 ENCOUNTER — Emergency Department (HOSPITAL_COMMUNITY)
Admission: EM | Admit: 2013-03-02 | Discharge: 2013-03-02 | Disposition: A | Discharge status: Home / Self Care | Payer: Medicaid Other | Source: Home / Self Care | Attending: Emergency Medicine | Admitting: Emergency Medicine

## 2013-03-02 ENCOUNTER — Encounter (HOSPITAL_COMMUNITY): Payer: Self-pay | Admitting: *Deleted

## 2013-03-02 DIAGNOSIS — M25559 Pain in unspecified hip: Secondary | ICD-10-CM

## 2013-03-02 DIAGNOSIS — M25551 Pain in right hip: Secondary | ICD-10-CM

## 2013-03-02 MED ORDER — IBUPROFEN 800 MG PO TABS
800.0000 mg | ORAL_TABLET | Freq: Once | ORAL | Status: AC
  Administered 2013-03-02: 800 mg via ORAL

## 2013-03-02 MED ORDER — HYDROCODONE-ACETAMINOPHEN 5-325 MG PO TABS
ORAL_TABLET | ORAL | Status: AC
  Filled 2013-03-02: qty 2

## 2013-03-02 MED ORDER — NAPROXEN 500 MG PO TABS: 500.0000 mg | ORAL_TABLET | Freq: Two times a day (BID) | ORAL | Status: DC

## 2013-03-02 MED ORDER — OXYCODONE-ACETAMINOPHEN 5-325 MG PO TABS: ORAL_TABLET | ORAL | Status: DC

## 2013-03-02 MED ORDER — HYDROCODONE-ACETAMINOPHEN 5-325 MG PO TABS
2.0000 | ORAL_TABLET | Freq: Once | ORAL | Status: AC
  Administered 2013-03-02: 2 via ORAL

## 2013-03-02 MED ORDER — IBUPROFEN 800 MG PO TABS
ORAL_TABLET | ORAL | Status: AC
  Filled 2013-03-02: qty 1

## 2013-03-02 NOTE — ED Notes (Signed)
Pt  Reports         Pain  r  Leg   Radiating  Downward  From the  Hip  Down      Pain is  Worse  On movement  And  posistion     -  denys  Any  specefic  Injury        Pt  Ambulated  To  Exam room     Sitting  Upright on  Exam table  Speaking in  Complete  sentances  And  Is  In no acute  Distress

## 2013-03-02 NOTE — ED Provider Notes (Addendum)
Chief Complaint:   Chief Complaint  Patient presents with  . Hip Pain    History of Present Illness:   Theresa Schroeder is a 27 year old female who experienced sudden onset of pain around 4 to 5 AM this morning centered in the right hip area with radiation into the thigh, the right lower quadrant of the abdomen, the right groin, and into the lower back. This was rated 7-8/10 in intensity. The pain was worse with any movement, walking, standing up, or sitting down. Nothing made any better. She denies any swelling, redness, or rash in the area. She did not injure her hip in any way. She's never had anything like this before. She has had no numbness or tingling in the leg no muscle weakness. She denies fever, chills, nausea, vomiting, urinary symptoms, constipation, diarrhea, or GYN complaints.  Review of Systems:  Other than noted above, the patient denies any of the following symptoms: Systemic:  No fevers, chills, sweats, or aches.  No fatigue or tiredness. Musculoskeletal:  No joint pain, arthritis, bursitis, swelling, back pain, or neck pain. Neurological:  No muscular weakness, paresthesias, headache, or trouble with speech or coordination.  No dizziness.  PMFSH:  Past medical history, family history, social history, meds, and allergies were reviewed.    Physical Exam:   Vital signs:  BP 114/79  Pulse 85  Temp(Src) 98.7 F (37.1 C) (Oral)  Resp 16  SpO2 100%  LMP 02/01/2013 Gen:  Alert and oriented times 3.  In no distress. Musculoskeletal: There is no pain to palpation either in the hip, the groin, the lower back, the flank area, or the abdomen. There is no pain to palpation in the anterior thigh over the lateral thigh. There is no swelling, erythema, or rash. She does have pain with any movement of the hip joint including rotation, flexion, or straight leg raising. Straight leg raising was positive on the right with increased pain with Lasegue's sign and popliteal compression. DTRs,  muscle strength, and sensation were normal.  Otherwise, all joints had a full a ROM with no swelling, bruising or deformity.  No edema, pulses full. Extremities were warm and pink.  Capillary refill was brisk.  Skin:  Clear, warm and dry.  No rash. Neuro:  Alert and oriented times 3.  Muscle strength was normal.  Sensation was intact to light touch.   Radiology:  Dg Hip Complete Right  03/02/2013  *RADIOLOGY REPORT*  Clinical Data: 27 year old female with acute onset severe right hip pain.  No known injury.  RIGHT HIP - COMPLETE 2+ VIEW  Comparison: None.  Findings: Bone mineralization is within normal limits.  Femoral heads normally located.  The joint spaces are preserved.  Pelvis intact.  Proximal right femur appears intact and within normal limits. Small calcific densities in the pelvis probably are phleboliths.  IMPRESSION: No acute osseous abnormality identified about the right hip or pelvis.   Original Report Authenticated By: Erskine Speed, M.D.    I reviewed the images independently and personally and concur with the radiologist's findings.  Course in Urgent Care Center:   She was given Norco 5/325, 2 tablets plus one tablet of ibuprofen 800 mg. She experienced slight relief of pain with these medications. She was given crutches for ambulation.  Assessment:  The encounter diagnosis was Hip pain, right.  This could be a lumbar radiculopathy, aseptic necrosis of the hip, muscle strain or inflammation, bursitis, tendinitis, or stress fracture. She'll need further workup, and I suggested that  she see an orthopedist as soon as possible. We'll probably need an MRI scan since the x-ray was negative.  Plan:   1.  The following meds were prescribed:   Discharge Medication List as of 03/02/2013 11:26 AM    START taking these medications   Details  naproxen (NAPROSYN) 500 MG tablet Take 1 tablet (500 mg total) by mouth 2 (two) times daily., Starting 03/02/2013, Until Discontinued, Normal     oxyCODONE-acetaminophen (PERCOCET) 5-325 MG per tablet 1 to 2 tablets every 6 hours as needed for pain., Print       2.  The patient was instructed in symptomatic care, including rest and activity, elevation, application of ice and compression.  Appropriate handouts were given. 3.  The patient was told to return if becoming worse in any way, if no better in 3 or 4 days, and given some red flag symptoms such as worsening pain or neurological symptoms that would indicate earlier return.   4.  The patient was told to follow up with Dr. Venita Lick as soon as possible for further evaluation and treatment.    Reuben Likes, MD 03/02/13 1520  Reuben Likes, MD 03/03/13 [phone]

## 2013-10-25 ENCOUNTER — Emergency Department (HOSPITAL_COMMUNITY)
Admission: EM | Admit: 2013-10-25 | Discharge: 2013-10-25 | Disposition: A | Discharge status: Home / Self Care | Payer: Medicaid Other | Source: Home / Self Care

## 2013-10-25 ENCOUNTER — Encounter (HOSPITAL_COMMUNITY): Payer: Self-pay | Admitting: Emergency Medicine

## 2013-10-25 DIAGNOSIS — H6120 Impacted cerumen, unspecified ear: Secondary | ICD-10-CM

## 2013-10-25 DIAGNOSIS — H6123 Impacted cerumen, bilateral: Secondary | ICD-10-CM

## 2013-10-25 MED ORDER — DOCUSATE SODIUM 50 MG/5ML PO LIQD
ORAL | Status: AC
  Filled 2013-10-25: qty 10

## 2013-10-25 NOTE — Discharge Instructions (Signed)
Cerumen Impaction A cerumen impaction is when the wax in your ear forms a plug. This plug usually causes reduced hearing. Sometimes it also causes an earache or dizziness. Removing a cerumen impaction can be difficult and painful. The wax sticks to the ear canal. The canal is sensitive and bleeds easily. If you try to remove a heavy wax buildup with a cotton tipped swab, you may push it in further. Irrigation with water, suction, and small ear curettes may be used to clear out the wax. If the impaction is fixed to the skin in the ear canal, ear drops may be needed for a few days to loosen the wax. People who build up a lot of wax frequently can use ear wax removal products available in your local drugstore. SEEK MEDICAL CARE IF:  You develop an earache, increased hearing loss, or marked dizziness. Document Released: 11/15/2004 Document Revised: 12/31/2011 Document Reviewed: 01/05/2010 [facility] Patient Information 2014 [place], [place].   May use Debrox over the counter routine to prevent this. Use it as directed.

## 2013-10-25 NOTE — ED Notes (Signed)
Co right ear pain States she can barley hear out of ear.  States she is not aware if ear is clogged up or not

## 2013-10-25 NOTE — ED Provider Notes (Signed)
CSN: [id number]     Arrival date & time 10/25/13  1334 History   None    Chief Complaint  Patient presents with  . Otalgia   (Consider location/radiation/quality/duration/timing/severity/associated sxs/prior Treatment) HPI Comments: Patient reports loss of hearing in the right ear; "feels like it is clogged". No pain, no drainage. No fever or URI symptoms.   Patient is a 28 y.o. female presenting with ear pain. The history is provided by the patient.  Otalgia   Past Medical History  Diagnosis Date  . Nystagmus   . Anemia     sickle cell anemia   Past Surgical History  Procedure Laterality Date  . Cataract extraction    . Cesarean section  2009   Family History  Problem Relation Age of Onset  . Anesthesia problems Neg Hx   . Miscarriages / Stillbirths Sister   . Cancer Maternal Aunt   . Diabetes Maternal Aunt   . Early death Maternal Uncle   . Cataracts Father    History  Substance Use Topics  . Smoking status: Never Smoker   . Smokeless tobacco: Never Used  . Alcohol Use: No   OB History   Grav Para Term Preterm Abortions TAB SAB Ect Mult Living   3 2 1 1 1 1  0 0 0 2     Review of Systems  HENT: Positive for ear pain.   All other systems reviewed and are negative.    Allergies  Review of patient's allergies indicates no known allergies.  Home Medications   Current Outpatient Rx  Name  Route  Sig  Dispense  Refill  . ferrous sulfate 325 (65 FE) MG tablet   Oral   Take 325 mg by mouth daily with breakfast.         . naproxen (NAPROSYN) 500 MG tablet   Oral   Take 1 tablet (500 mg total) by mouth 2 (two) times daily.   30 tablet   0   . oxyCODONE-acetaminophen (PERCOCET) 5-325 MG per tablet      1 to 2 tablets every 6 hours as needed for pain.   20 tablet   0   . Prenatal Vit-Fe Fumarate-FA (PRENATAL MULTIVITAMIN) TABS   Oral   Take 1 tablet by mouth daily.          LMP 10/14/2013 Physical Exam  Nursing note and vitals  reviewed. Constitutional: She is oriented to person, place, and time. She appears well-developed and well-nourished. No distress.  HENT:  Head: Normocephalic and atraumatic.  Both ears with moderate cerumen impaction  Eyes: Pupils are equal, round, and reactive to light. Right eye exhibits no discharge. Left eye exhibits no discharge.  Neurological: She is alert and oriented to person, place, and time.  Skin: Skin is warm and dry. No rash noted.  Psychiatric: Her behavior is normal.    ED Course  EAR CERUMEN REMOVAL Date/Time: 10/25/2013 4:07 PM Performed by: Bjorn Pippin Authorized by: Bjorn Pippin Consent: Verbal consent obtained. Risks and benefits: risks, benefits and alternatives were discussed Consent given by: patient   (including critical care time) Labs Review Labs Reviewed - No data to display Imaging Review No results found.  EKG Interpretation    Date/Time:    Ventricular Rate:    PR Interval:    QRS Duration:   QT Interval:    QTC Calculation:   R Axis:     Text Interpretation:  MDM   1. Cerumen impaction, bilateral     S/P Irrigations with improvement. Continue Debrox at home prn to prevent further episodes.   Bjorn Pippin, PA-C 10/29/13 1020

## 2013-10-31 NOTE — ED Provider Notes (Signed)
Medical screening examination/treatment/procedure(s) were performed by a resident physician or non-physician practitioner and as the supervising physician I was immediately available for consultation/collaboration.  Lynne Leader, MD    Gregor Hams, MD 10/31/13 [phone]

## 2013-11-03 ENCOUNTER — Emergency Department (HOSPITAL_COMMUNITY)
Admission: EM | Admit: 2013-11-03 | Discharge: 2013-11-03 | Disposition: A | Discharge status: Home / Self Care | Payer: Medicaid Other | Source: Home / Self Care | Attending: Emergency Medicine | Admitting: Emergency Medicine

## 2013-11-03 ENCOUNTER — Encounter (HOSPITAL_COMMUNITY): Payer: Self-pay | Admitting: Emergency Medicine

## 2013-11-03 DIAGNOSIS — H6090 Unspecified otitis externa, unspecified ear: Secondary | ICD-10-CM

## 2013-11-03 DIAGNOSIS — H6120 Impacted cerumen, unspecified ear: Secondary | ICD-10-CM

## 2013-11-03 MED ORDER — NEOMYCIN-POLYMYXIN-HC 3.5-10000-1 OT SOLN: 3.0000 [drp] | Freq: Four times a day (QID) | OTIC | Status: DC

## 2013-11-03 NOTE — ED Notes (Signed)
Pt here for follow up of clogged right ear.  Pt states still having mild pain.  No relief with ear drops.  Pt also tried to irrigate right ear with no relief.

## 2013-11-03 NOTE — ED Provider Notes (Signed)
Medical screening examination/treatment/procedure(s) were performed by non-physician practitioner and as supervising physician I was immediately available for consultation/collaboration.  Hernandez Losasso, M.D.  Nina Hoar C Mckinzee Spirito, MD 11/03/13 2111 

## 2013-11-03 NOTE — Discharge Instructions (Signed)
Cerumen Impaction A cerumen impaction is when the wax in your ear forms a plug. This plug usually causes reduced hearing. Sometimes it also causes an earache or dizziness. Removing a cerumen impaction can be difficult and painful. The wax sticks to the ear canal. The canal is sensitive and bleeds easily. If you try to remove a heavy wax buildup with a cotton tipped swab, you may push it in further. Irrigation with water, suction, and small ear curettes may be used to clear out the wax. If the impaction is fixed to the skin in the ear canal, ear drops may be needed for a few days to loosen the wax. People who build up a lot of wax frequently can use ear wax removal products available in your local drugstore. SEEK MEDICAL CARE IF:  You develop an earache, increased hearing loss, or marked dizziness. Document Released: 11/15/2004 Document Revised: 12/31/2011 Document Reviewed: 01/05/2010 [employer] Patient Information 2014 [employer].  

## 2013-11-03 NOTE — ED Provider Notes (Signed)
CSN: [id number]     Arrival date & time 11/03/13  1619 History   First MD Initiated Contact with Patient 11/03/13 1744     Chief Complaint  Patient presents with  . Follow-up    right ear   (Consider location/radiation/quality/duration/timing/severity/associated sxs/prior Treatment) HPI Comments: 28 yo female presents for re-evaluation of cerumen impaction. She has been using debrox at home AD and feels right ear is till clogged and mildly tender. She denies any other symptoms but would like to have ear cleaned out.   Past Medical History  Diagnosis Date  . Nystagmus   . Anemia     sickle cell anemia   Past Surgical History  Procedure Laterality Date  . Cataract extraction    . Cesarean section  2009   Family History  Problem Relation Age of Onset  . Anesthesia problems Neg Hx   . Miscarriages / Stillbirths Sister   . Cancer Maternal Aunt   . Diabetes Maternal Aunt   . Early death Maternal Uncle   . Cataracts Father    History  Substance Use Topics  . Smoking status: Never Smoker   . Smokeless tobacco: Never Used  . Alcohol Use: No   OB History   Grav Para Term Preterm Abortions TAB SAB Ect Mult Living   3 2 1 1 1 1  0 0 0 2     Review of Systems  HENT: Positive for ear pain.   All other systems reviewed and are negative.    Allergies  Review of patient's allergies indicates no known allergies.  Home Medications   Current Outpatient Rx  Name  Route  Sig  Dispense  Refill  . ferrous sulfate 325 (65 FE) MG tablet   Oral   Take 325 mg by mouth daily with breakfast.         . naproxen (NAPROSYN) 500 MG tablet   Oral   Take 1 tablet (500 mg total) by mouth 2 (two) times daily.   30 tablet   0   . oxyCODONE-acetaminophen (PERCOCET) 5-325 MG per tablet      1 to 2 tablets every 6 hours as needed for pain.   20 tablet   0   . Prenatal Vit-Fe Fumarate-FA (PRENATAL MULTIVITAMIN) TABS   Oral   Take 1 tablet by mouth daily.          BP 125/86   Pulse 95  Temp(Src) 98.5 F (36.9 C) (Oral)  Resp 16  Ht 5\' 3"  (1.6 m)  Wt 196 lb (88.905 kg)  BMI 34.73 kg/m2  SpO2 100%  LMP 10/14/2013 Physical Exam  Nursing note and vitals reviewed. Constitutional: She appears well-developed and well-nourished.  HENT:  Head: Normocephalic and atraumatic.  Right Ear: External ear normal.  Left Ear: External ear normal.  Nose: Nose normal.  Mouth/Throat: Oropharynx is clear and moist. No oropharyngeal exudate.  Right ear with thick moist cerumen with attempted lavage with minimal removal. Canal was erythematous/ emaciated  Musculoskeletal: Normal range of motion.  Neurological: She is alert.  Skin: Skin is warm and dry.  Psychiatric: She has a normal mood and affect. Judgment normal.    ED Course  Procedures (including critical care time) Labs Review Labs Reviewed - No data to display Imaging Review No results found.    MDM  1. ? otits externa Right- Cipro otic HC- AD , w/c f/u sx increase 2. Cerumen impaction with lavage- with mild improvement but cerumen still present, d/c debrox for now after  completes ABX will resume maintenance debrox 2 x week    Ardis Hughs, PA-C 11/03/13 2046

## 2013-11-14 ENCOUNTER — Emergency Department (HOSPITAL_COMMUNITY)
Admission: EM | Admit: 2013-11-14 | Discharge: 2013-11-14 | Disposition: A | Discharge status: Home / Self Care | Payer: Medicaid Other | Attending: Emergency Medicine | Admitting: Emergency Medicine

## 2013-11-14 ENCOUNTER — Encounter (HOSPITAL_COMMUNITY): Payer: Self-pay | Admitting: Emergency Medicine

## 2013-11-14 DIAGNOSIS — H6120 Impacted cerumen, unspecified ear: Secondary | ICD-10-CM

## 2013-11-14 DIAGNOSIS — Z862 Personal history of diseases of the blood and blood-forming organs and certain disorders involving the immune mechanism: Secondary | ICD-10-CM | POA: Insufficient documentation

## 2013-11-14 DIAGNOSIS — R059 Cough, unspecified: Secondary | ICD-10-CM | POA: Insufficient documentation

## 2013-11-14 DIAGNOSIS — R05 Cough: Secondary | ICD-10-CM | POA: Insufficient documentation

## 2013-11-14 DIAGNOSIS — H9190 Unspecified hearing loss, unspecified ear: Secondary | ICD-10-CM | POA: Insufficient documentation

## 2013-11-14 NOTE — ED Notes (Signed)
The pt has had difficulty hearing from  Her rt ear since dec 25th .  She has been at [facility] for impacted ear wax but the hearing is no better.  No temp

## 2013-11-14 NOTE — ED Provider Notes (Signed)
CSN: [id number]     Arrival date & time 11/14/13  1512 History  This chart was scribed for non-physician practitioner Noland Fordyce, working with Blanchard Kelch, MD by Donato Schultz, ED Scribe. This patient was seen in room TR04C/TR04C and the patient's care was started at 4:50 PM.    Chief Complaint  Patient presents with  . ear pain     The history is provided by the patient. No language interpreter was used.   HPI Comments: Theresa Schroeder is a 28 y.o. female who presents to the Emergency Department complaining of some hearing loss from her right ear.  She states that she has visited Urgent Care and was prescribed Neomycin and Polymyxin B Sulfates and Hydrocortisone Otic Solutionand Debrox with no relief to her symptoms.  She states that she also had impacted ear wax removed at Urgent Care two weeks ago but states that her hearing has not improved.  She denies fever, nausea, vomiting, ear pain, and congestion as associated symptoms.  She lists mild cough as an associated symptom.  She denies experiencing these symptoms before.  She denies having a PCP.   Past Medical History  Diagnosis Date  . Nystagmus   . Anemia     sickle cell anemia   Past Surgical History  Procedure Laterality Date  . Cataract extraction    . Cesarean section  2009   Family History  Problem Relation Age of Onset  . Anesthesia problems Neg Hx   . Miscarriages / Stillbirths Sister   . Cancer Maternal Aunt   . Diabetes Maternal Aunt   . Early death Maternal Uncle   . Cataracts Father    History  Substance Use Topics  . Smoking status: Never Smoker   . Smokeless tobacco: Never Used  . Alcohol Use: No   OB History   Grav Para Term Preterm Abortions TAB SAB Ect Mult Living   3 2 1 1 1 1  0 0 0 2     Review of Systems  Constitutional: Negative for fever.  HENT: Positive for hearing loss (right ear). Negative for congestion and ear pain.   Respiratory: Positive for cough (mild).    Gastrointestinal: Negative for nausea and vomiting.  All other systems reviewed and are negative.    Allergies  Review of patient's allergies indicates no known allergies.  Home Medications  No current outpatient prescriptions on file.  Triage Vitals: BP 131/83  Pulse 84  Temp(Src) 98.4 F (36.9 C)  Resp 16  Ht 5\' 5"  (1.651 m)  Wt 202 lb (91.627 kg)  BMI 33.61 kg/m2  SpO2 100%  LMP 10/14/2013  Physical Exam  Nursing note and vitals reviewed. Constitutional: She is oriented to person, place, and time. She appears well-developed and well-nourished. No distress.  HENT:  Head: Normocephalic and atraumatic.  Cerumen impaction of right ear.  Eyes: EOM are normal.  Neck: Neck supple. No tracheal deviation present.  Cardiovascular: Normal rate.   Pulmonary/Chest: Effort normal. No respiratory distress.  Musculoskeletal: Normal range of motion.  Neurological: She is alert and oriented to person, place, and time.  Skin: Skin is warm and dry.  Psychiatric: She has a normal mood and affect. Her behavior is normal.    ED Course  Procedures   Ceruminosis is noted.  Wax is removed by syringing and manual debridement. Instructions for home care to prevent wax buildup are given.   DIAGNOSTIC STUDIES: Oxygen Saturation is 100% on room air, normal by my interpretation.  COORDINATION OF CARE:. 4:54 PM- Advised the patient to flush out her right ear at home and the patient agreed to the treatment plan.   Labs Review Labs Reviewed - No data to display Imaging Review No results found.  EKG Interpretation   None       MDM   1. Hearing loss secondary to cerumen impaction    Pt presenting with hearing loss in right ear due to cerumen impaction. Due to pt using debrox at home, cerumen was softened, large amount was able to be removed via curette and syringe with water and peroxide.  TM visible: mild erythema. Not bulging or perforated.    I personally performed the  services described in this documentation, which was scribed in my presence. The recorded information has been reviewed and is accurate.    Noland Fordyce, PA-C 11/14/13 1726

## 2013-11-14 NOTE — Discharge Instructions (Signed)
Cerumen Impaction The structures of the external ear canal secrete a waxy substance known as cerumen. Excess cerumen can build up in the ear canal, causing a condition known as cerumen impaction. Cerumen impaction can cause ear pain as well as disrupt the function of the ear. The rate of cerumen production differs for each individual. For certain individuals, the configuration of one's ear canal may cause him or her to have a decreased ability to naturally remove cerumen. It is important to note that removing cerumen as a part of normal hygiene is not necessary, and the use of swabs in the ear canal is not recommended. SYMPTOMS   Diminished hearing.  Ear drainage.  Ear pain.  Ear itch. CAUSES  Excessive cerumen production.  RISK INCREASES WITH:  Frequent use of swabs to clean ears.  Narrow ear canals.  Eczema (a skin condition).  Dehydration. PREVENTION  Do Not insert objects into the ear, even with the intent of cleaning the ear.  Maintain hydration.  Control eczema if present. TREATMENT  Maintaining preventative measures is the best way to treat cerumen impaction. If symptoms of cerumen impaction develop the first step is to use over-the-counter or prescription ear drops that are intended to soften the cerumen. If the cerumen does not clear, then visit your caregiver to have the cerumen removed. The most common method for cerumen removal is through irrigation with warm water. Although, some caregivers use ear curettes and other instruments to remove the cerumen physically. In the most severe cases, cerumen may be removed surgically. Document Released: 10/08/2005 Document Revised: 12/31/2011 Document Reviewed: 01/20/2009 [facility] Patient Information 2014 [place], [place].  Hearing Loss A hearing loss is sometimes called deafness. Hearing loss may be partial or total. CAUSES Hearing loss may be caused by:  Wax in the ear canal.  Infection of the ear canal.  Infection of the  middle ear.  Trauma to the ear or surrounding area.  Fluid in the middle ear.  A hole in the eardrum (perforated eardrum).  Exposure to loud sounds or music.  Problems with the hearing nerve.  Certain medications. Hearing loss without wax, infection, or a history of injury may mean that the nerve is involved. Hearing loss with severe dizziness, nausea and vomiting or ringing in the ear may suggest a hearing nerve irritation or problems in the middle or inner ear. If hearing loss is untreated, there is a greater likelihood for residual or permanent hearing loss. DIAGNOSIS A hearing test (audiometry) assesses hearing loss. The audiometry test needs to be performed by a hearing specialist (audiologist). TREATMENT Treatment for recent onset of hearing loss may include:  Ear wax removal.  Medications that kill germs (antibiotics).  Cortisone medications.  Prompt follow up with the appropriate specialist. Return of hearing depends on the cause of your hearing loss, so proper medical follow-up is important. Some hearing loss may not be reversible, and a caregiver should discuss care and treatment options with you. SEEK MEDICAL CARE IF:   You have a severe headache, dizziness, or changes in vision.  You have new or increased weakness.  You develop repeated vomiting or other serious medical problems.  You have a fever. Document Released: 10/08/2005 Document Revised: 12/31/2011 Document Reviewed: 02/02/2010 [facility] Patient Information 2014 [place], [place].

## 2013-11-15 NOTE — ED Provider Notes (Signed)
Medical screening examination/treatment/procedure(s) were performed by non-physician practitioner and as supervising physician I was immediately available for consultation/collaboration.  EKG Interpretation   None         Blanchard Kelch, MD 11/15/13 [phone]

## 2014-08-09 IMAGING — CR DG HIP COMPLETE 2+V*R*
3 series · 3 of 3 positions shown · non-contrast
Comparison: None.

CLINICAL DATA: 26-year-old female with acute onset severe right hip
pain.  No known injury.

RIGHT HIP - COMPLETE 2+ VIEW

[view not recorded (1 of 3)]
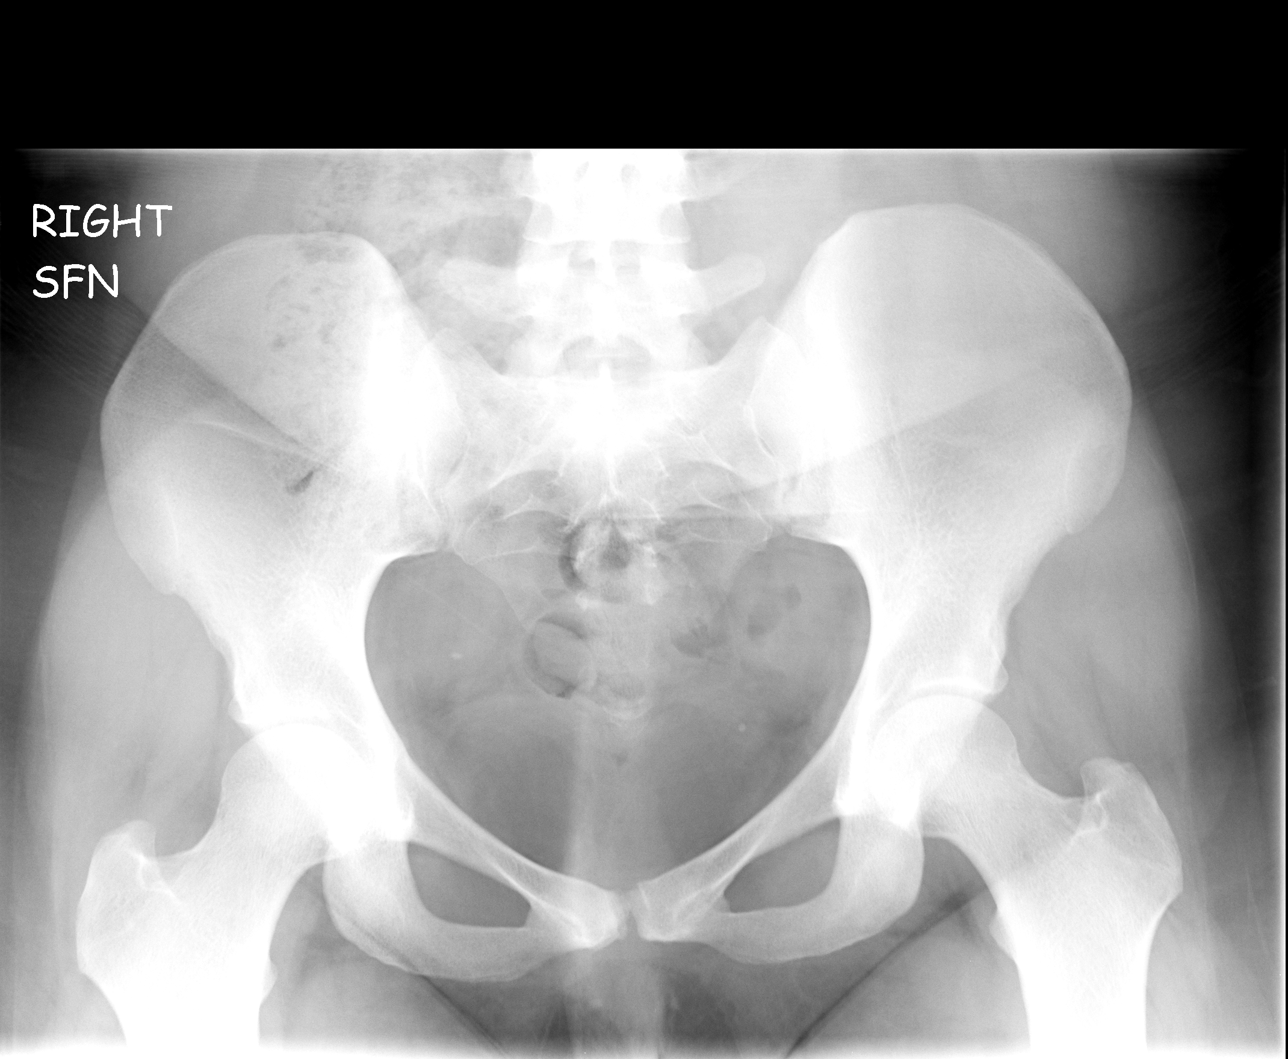

[view not recorded (2 of 3)]
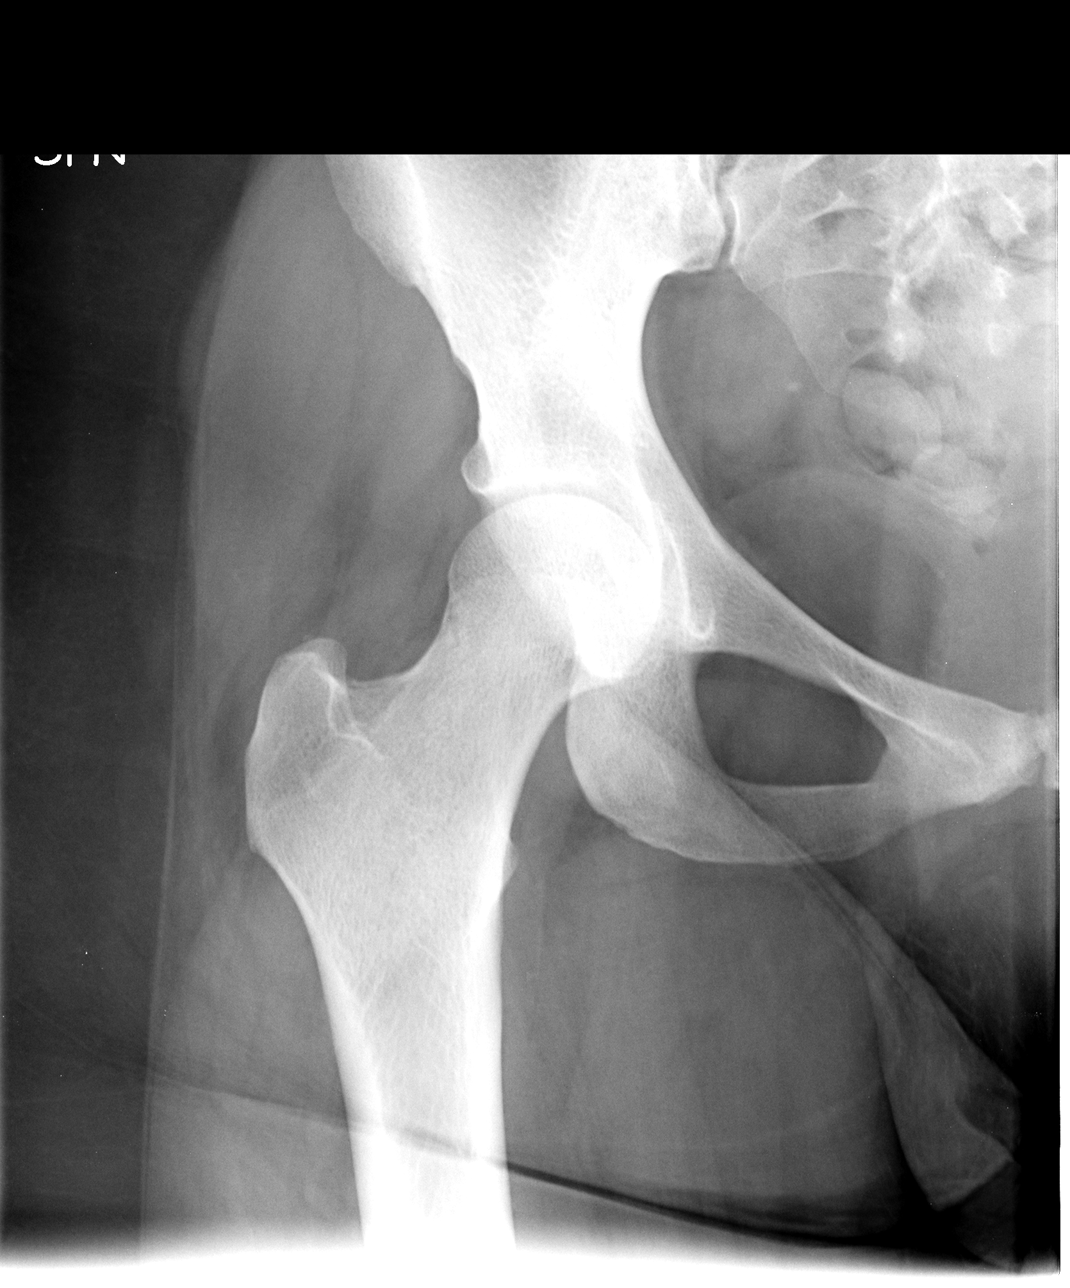

[view not recorded (3 of 3)]
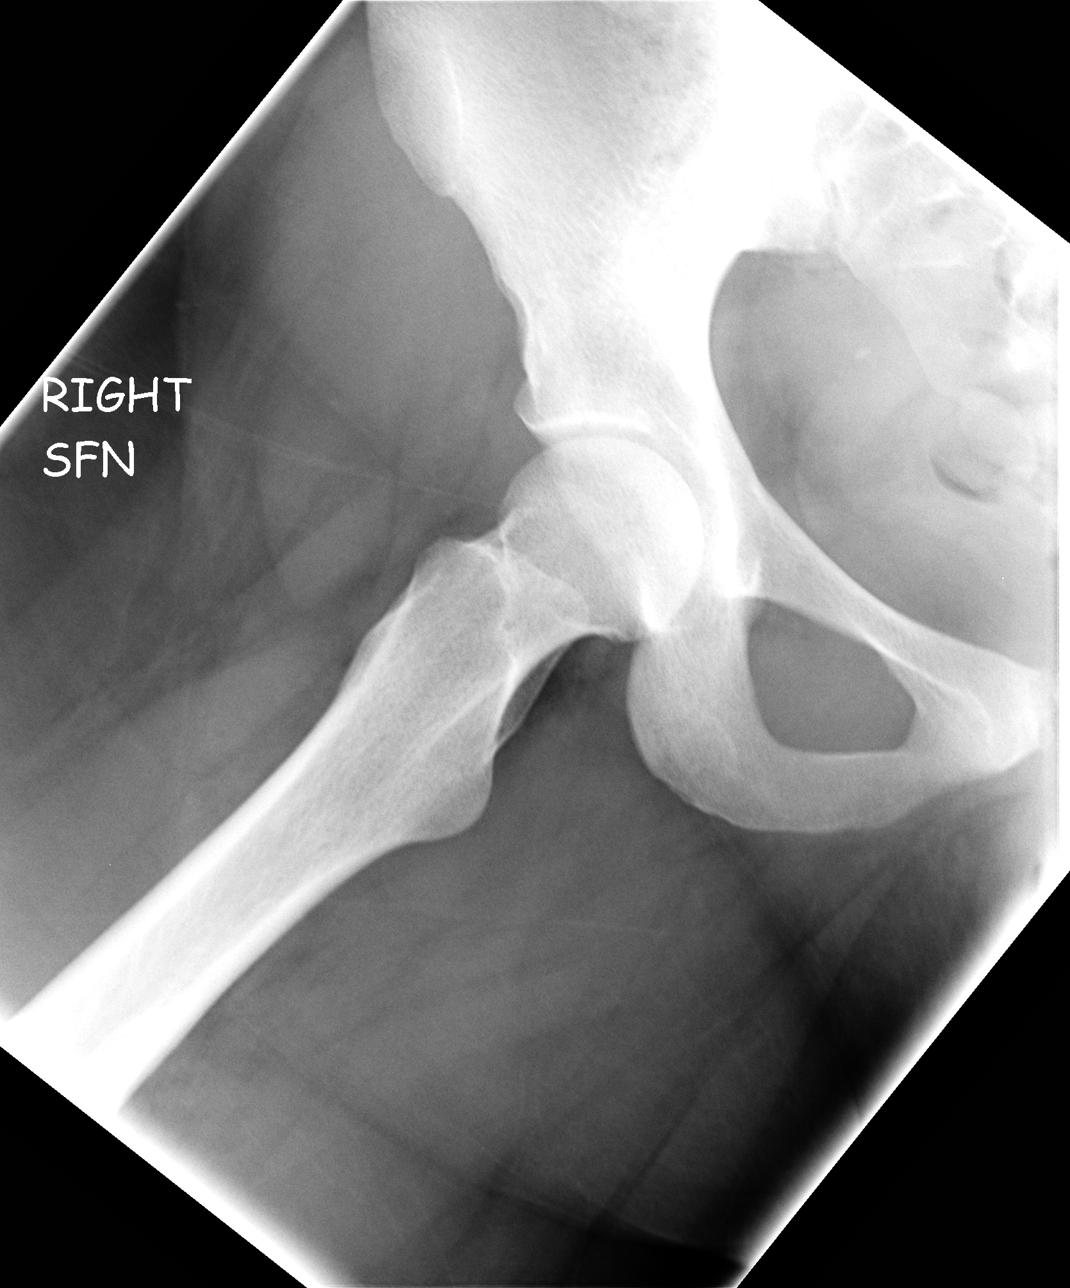

[3 of 3 positions shown; findings below may reference images not displayed]

FINDINGS: Bone mineralization is within normal limits.  Femoral
heads normally located.  The joint spaces are preserved.  Pelvis
intact.

Proximal right femur appears intact and within normal limits.
Small calcific densities in the pelvis probably are phleboliths.
IMPRESSION: No acute osseous abnormality identified about the right hip or
pelvis.

## 2014-08-23 ENCOUNTER — Encounter (HOSPITAL_COMMUNITY): Payer: Self-pay | Admitting: Emergency Medicine

## 2015-01-08 ENCOUNTER — Other Ambulatory Visit (HOSPITAL_COMMUNITY)
Admission: RE | Admit: 2015-01-08 | Discharge: 2015-01-08 | Disposition: A | Discharge status: Home / Self Care | Payer: Medicaid Other | Source: Ambulatory Visit | Attending: Family Medicine | Admitting: Family Medicine

## 2015-01-08 ENCOUNTER — Encounter (HOSPITAL_COMMUNITY): Payer: Self-pay | Admitting: Emergency Medicine

## 2015-01-08 ENCOUNTER — Emergency Department (HOSPITAL_COMMUNITY)
Admission: EM | Admit: 2015-01-08 | Discharge: 2015-01-08 | Disposition: A | Discharge status: Home / Self Care | Payer: Medicaid Other | Source: Home / Self Care | Attending: Family Medicine | Admitting: Family Medicine

## 2015-01-08 DIAGNOSIS — N760 Acute vaginitis: Secondary | ICD-10-CM

## 2015-01-08 DIAGNOSIS — M79606 Pain in leg, unspecified: Secondary | ICD-10-CM

## 2015-01-08 DIAGNOSIS — Z113 Encounter for screening for infections with a predominantly sexual mode of transmission: Secondary | ICD-10-CM | POA: Insufficient documentation

## 2015-01-08 LAB — POCT URINALYSIS DIP (DEVICE)
BILIRUBIN URINE: NEGATIVE
Glucose, UA: NEGATIVE mg/dL
KETONES UR: NEGATIVE mg/dL
NITRITE: NEGATIVE
PROTEIN: NEGATIVE mg/dL
SPECIFIC GRAVITY, URINE: 1.015 (ref 1.005–1.030)
Urobilinogen, UA: 0.2 mg/dL (ref 0.0–1.0)
pH: 6 (ref 5.0–8.0)

## 2015-01-08 LAB — POCT PREGNANCY, URINE: Preg Test, Ur: NEGATIVE

## 2015-01-08 MED ORDER — METRONIDAZOLE 500 MG PO TABS: 500.0000 mg | ORAL_TABLET | Freq: Two times a day (BID) | ORAL | Status: DC

## 2015-01-08 NOTE — ED Provider Notes (Signed)
CSN: [id number]     Arrival date & time 01/08/15  1150 History   First MD Initiated Contact with Patient 01/08/15 1240     Chief Complaint  Patient presents with  . Vaginal Pain   (Consider location/radiation/quality/duration/timing/severity/associated sxs/prior Treatment) HPI         29 year old female presents complaining of swelling around her vagina and left quadriceps pain . She says her left quadriceps has been hurting for 2 days since she exercised. No injury or swelling. Also she complains of her vagina has felt swollen and is very minimally painful. No discharge. She has an IUD for birth control. She is sexually active. Her periods have been irregular since getting the IUD, last period was in January.  Past Medical History  Diagnosis Date  . Nystagmus   . Anemia     sickle cell anemia   Past Surgical History  Procedure Laterality Date  . Cataract extraction    . Cesarean section  2009   Family History  Problem Relation Age of Onset  . Anesthesia problems Neg Hx   . Miscarriages / Stillbirths Sister   . Cancer Maternal Aunt   . Diabetes Maternal Aunt   . Early death Maternal Uncle   . Cataracts Father    History  Substance Use Topics  . Smoking status: Never Smoker   . Smokeless tobacco: Never Used  . Alcohol Use: No   OB History    Gravida Para Term Preterm AB TAB SAB Ectopic Multiple Living   3 2 1 1 1 1  0 0 0 2     Review of Systems  Constitutional: Negative for fever and chills.  Genitourinary: Positive for vaginal discharge and vaginal pain. Negative for dysuria, urgency, flank pain, genital sores and pelvic pain.  Musculoskeletal: Positive for myalgias (left leg pain).  All other systems reviewed and are negative.   Allergies  Review of patient's allergies indicates no known allergies.  Home Medications   Prior to Admission medications   Medication Sig Start Date End Date Taking? Authorizing Provider  metroNIDAZOLE (FLAGYL) 500 MG tablet Take 1  tablet (500 mg total) by mouth 2 (two) times daily. 01/08/15   Freeman Caldron Aalyiah Camberos, PA-C   BP 141/83 mmHg  Pulse 78  Temp(Src) 99.7 F (37.6 C) (Oral)  Resp 18  SpO2 97%  LMP 10/30/2014 Physical Exam  Constitutional: She is oriented to person, place, and time. Vital signs are normal. She appears well-developed and well-nourished. No distress.  HENT:  Head: Normocephalic and atraumatic.  Pulmonary/Chest: Effort normal. No respiratory distress.  Genitourinary: There is no tenderness, lesion or injury on the right labia. There is no tenderness, lesion or injury on the left labia. Cervix exhibits no motion tenderness, no discharge and no friability. Right adnexum displays no mass, no tenderness and no fullness. Left adnexum displays no mass, no tenderness and no fullness. No erythema, tenderness or bleeding in the vagina. Vaginal discharge found.  Lymphadenopathy:       Right: No inguinal adenopathy present.       Left: No inguinal adenopathy present.  Neurological: She is alert and oriented to person, place, and time. She has normal strength. Coordination normal.  Skin: Skin is warm and dry. No rash noted. She is not diaphoretic.  Psychiatric: She has a normal mood and affect. Judgment normal.  Nursing note and vitals reviewed.   ED Course  Procedures (including critical care time) Labs Review Labs Reviewed  POCT URINALYSIS DIP (DEVICE) - Abnormal; Notable for  the following:    Hgb urine dipstick TRACE (*)    Leukocytes, UA SMALL (*)    All other components within normal limits  POCT PREGNANCY, URINE  CERVICOVAGINAL ANCILLARY ONLY    Imaging Review No results found.   MDM   1. Vaginitis   2. Pain of lower extremity, unspecified laterality    Most likely bacterial vaginosis. Treat metronidazole. She also has mild muscle soreness, advised ibuprofen or Tylenol as needed.  Meds ordered this encounter  Medications  . metroNIDAZOLE (FLAGYL) 500 MG tablet    Sig: Take 1 tablet (500  mg total) by mouth 2 (two) times daily.    Dispense:  14 tablet    Refill:  0       Liam Graham, PA-C 01/08/15 1344

## 2015-01-08 NOTE — ED Notes (Signed)
Vaginal pain, leg pain for 2 days

## 2015-01-08 NOTE — Discharge Instructions (Signed)
Bacterial Vaginosis °Bacterial vaginosis is a vaginal infection that occurs when the normal balance of bacteria in the vagina is disrupted. It results from an overgrowth of certain bacteria. This is the most common vaginal infection in women of childbearing age. Treatment is important to prevent complications, especially in pregnant women, as it can cause a premature delivery. °CAUSES  °Bacterial vaginosis is caused by an increase in harmful bacteria that are normally present in smaller amounts in the vagina. Several different kinds of bacteria can cause bacterial vaginosis. However, the reason that the condition develops is not fully understood. °RISK FACTORS °Certain activities or behaviors can put you at an increased risk of developing bacterial vaginosis, including: °· Having a new sex partner or multiple sex partners. °· Douching. °· Using an intrauterine device (IUD) for contraception. °Women do not get bacterial vaginosis from toilet seats, bedding, swimming pools, or contact with objects around them. °SIGNS AND SYMPTOMS  °Some women with bacterial vaginosis have no signs or symptoms. Common symptoms include: °· Grey vaginal discharge. °· A fishlike odor with discharge, especially after sexual intercourse. °· Itching or burning of the vagina and vulva. °· Burning or pain with urination. °DIAGNOSIS  °Your health care provider will take a medical history and examine the vagina for signs of bacterial vaginosis. A sample of vaginal fluid may be taken. Your health care provider will look at this sample under a microscope to check for bacteria and abnormal cells. A vaginal pH test may also be done.  °TREATMENT  °Bacterial vaginosis may be treated with antibiotic medicines. These may be given in the form of a pill or a vaginal cream. A second round of antibiotics may be prescribed if the condition comes back after treatment.  °HOME CARE INSTRUCTIONS  °· Only take over-the-counter or prescription medicines as  directed by your health care provider. °· If antibiotic medicine was prescribed, take it as directed. Make sure you finish it even if you start to feel better. °· Do not have sex until treatment is completed. °· Tell all sexual partners that you have a vaginal infection. They should see their health care provider and be treated if they have problems, such as a mild rash or itching. °· Practice safe sex by using condoms and only having one sex partner. °SEEK MEDICAL CARE IF:  °· Your symptoms are not improving after 3 days of treatment. °· You have increased discharge or pain. °· You have a fever. °MAKE SURE YOU:  °· Understand these instructions. °· Will watch your condition. °· Will get help right away if you are not doing well or get worse. °FOR MORE INFORMATION  °Centers for Disease Control and Prevention, Division of STD Prevention: www.cdc.gov/std °American Sexual Health Association (ASHA): www.ashastd.org  °Document Released: 10/08/2005 Document Revised: 07/29/2013 Document Reviewed: 05/20/2013 °ExitCare® Patient Information ©2015 [employer]. This information is not intended to replace advice given to you by your health care provider. Make sure you discuss any questions you have with your health care provider. ° °Vaginitis °Vaginitis is an inflammation of the vagina. It is most often caused by a change in the normal balance of the bacteria and yeast that live in the vagina. This change in balance causes an overgrowth of certain bacteria or yeast, which causes the inflammation. There are different types of vaginitis, but the most common types are: °· Bacterial vaginosis. °· Yeast infection (candidiasis). °· Trichomoniasis vaginitis. This is a sexually transmitted infection (STI). °· Viral vaginitis. °· Atropic vaginitis. °· Allergic vaginitis. °  CAUSES  °The cause depends on the type of vaginitis. Vaginitis can be caused by: °· Bacteria (bacterial vaginosis). °· Yeast (yeast infection). °· A parasite  (trichomoniasis vaginitis) °· A virus (viral vaginitis). °· Low hormone levels (atrophic vaginitis). Low hormone levels can occur during pregnancy, breastfeeding, or after menopause. °· Irritants, such as bubble baths, scented tampons, and feminine sprays (allergic vaginitis). °Other factors can change the normal balance of the yeast and bacteria that live in the vagina. These include: °· Antibiotic medicines. °· Poor hygiene. °· Diaphragms, vaginal sponges, spermicides, birth control pills, and intrauterine devices (IUD). °· Sexual intercourse. °· Infection. °· Uncontrolled diabetes. °· A weakened immune system. °SYMPTOMS  °Symptoms can vary depending on the cause of the vaginitis. Common symptoms include: °· Abnormal vaginal discharge. °¨ The discharge is white, gray, or yellow with bacterial vaginosis. °¨ The discharge is thick, white, and cheesy with a yeast infection. °¨ The discharge is frothy and yellow or greenish with trichomoniasis. °· A bad vaginal odor. °¨ The odor is fishy with bacterial vaginosis. °· Vaginal itching, pain, or swelling. °· Painful intercourse. °· Pain or burning when urinating. °Sometimes, there are no symptoms. °TREATMENT  °Treatment will vary depending on the type of infection.  °· Bacterial vaginosis and trichomoniasis are often treated with antibiotic creams or pills. °· Yeast infections are often treated with antifungal medicines, such as vaginal creams or suppositories. °· Viral vaginitis has no cure, but symptoms can be treated with medicines that relieve discomfort. Your sexual partner should be treated as well. °· Atrophic vaginitis may be treated with an estrogen cream, pill, suppository, or vaginal ring. If vaginal dryness occurs, lubricants and moisturizing creams may help. You may be told to avoid scented soaps, sprays, or douches. °· Allergic vaginitis treatment involves quitting the use of the product that is causing the problem. Vaginal creams can be used to treat the  symptoms. °HOME CARE INSTRUCTIONS  °· Take all medicines as directed by your caregiver. °· Keep your genital area clean and dry. Avoid soap and only rinse the area with water. °· Avoid douching. It can remove the healthy bacteria in the vagina. °· Do not use tampons or have sexual intercourse until your vaginitis has been treated. Use sanitary pads while you have vaginitis. °· Wipe from front to back. This avoids the spread of bacteria from the rectum to the vagina. °· Let air reach your genital area. °¨ Wear cotton underwear to decrease moisture buildup. °¨ Avoid wearing underwear while you sleep until your vaginitis is gone. °¨ Avoid tight pants and underwear or nylons without a cotton panel. °¨ Take off wet clothing (especially bathing suits) as soon as possible. °· Use mild, non-scented products. Avoid using irritants, such as: °¨ Scented feminine sprays. °¨ Fabric softeners. °¨ Scented detergents. °¨ Scented tampons. °¨ Scented soaps or bubble baths. °· Practice safe sex and use condoms. Condoms may prevent the spread of trichomoniasis and viral vaginitis. °SEEK MEDICAL CARE IF:  °· You have abdominal pain. °· You have a fever or persistent symptoms for more than 2-3 days. °· You have a fever and your symptoms suddenly get worse. °Document Released: 08/05/2007 Document Revised: 07/02/2012 Document Reviewed: 03/20/2012 °ExitCare® Patient Information ©2015 [employer]. This information is not intended to replace advice given to you by your health care provider. Make sure you discuss any questions you have with your health care provider. ° °

## 2015-01-10 LAB — CERVICOVAGINAL ANCILLARY ONLY
Chlamydia: NEGATIVE
Neisseria Gonorrhea: NEGATIVE
WET PREP (BD AFFIRM): NEGATIVE
WET PREP (BD AFFIRM): NEGATIVE
WET PREP (BD AFFIRM): POSITIVE — AB

## 2015-01-10 MED ORDER — FLUCONAZOLE 150 MG PO TABS: 150.0000 mg | ORAL_TABLET | Freq: Once | ORAL | Status: DC

## 2015-01-12 ENCOUNTER — Telehealth (HOSPITAL_COMMUNITY): Payer: Self-pay | Admitting: *Deleted

## 2015-01-12 NOTE — ED Notes (Signed)
GC/Chlamydia neg., Affirm: Candida pos., Gardnerella and Trich neg. Archie Balboa PA e-prescribed  Diflucan to pt.'s pharmacy on 3/21.  I called pt. Pt. verified x 2 and given results. Pt. told she needs Diflucan for a yeast infection and where to pick it up.  Instructed to use refill in 5 days if still having symptoms. Pt. voiced understanding. Roselyn Meier 01/12/2015

## 2015-04-20 ENCOUNTER — Emergency Department (HOSPITAL_COMMUNITY)
Admission: EM | Admit: 2015-04-20 | Discharge: 2015-04-20 | Disposition: A | Discharge status: Home / Self Care | Payer: Medicaid Other | Source: Home / Self Care | Attending: Emergency Medicine | Admitting: Emergency Medicine

## 2015-04-20 ENCOUNTER — Other Ambulatory Visit (HOSPITAL_COMMUNITY)
Admission: RE | Admit: 2015-04-20 | Discharge: 2015-04-20 | Disposition: A | Discharge status: Home / Self Care | Payer: Medicaid Other | Source: Ambulatory Visit | Attending: Emergency Medicine | Admitting: Emergency Medicine

## 2015-04-20 ENCOUNTER — Encounter (HOSPITAL_COMMUNITY): Payer: Self-pay | Admitting: Emergency Medicine

## 2015-04-20 DIAGNOSIS — S93401A Sprain of unspecified ligament of right ankle, initial encounter: Secondary | ICD-10-CM | POA: Diagnosis not present

## 2015-04-20 DIAGNOSIS — B373 Candidiasis of vulva and vagina: Secondary | ICD-10-CM

## 2015-04-20 DIAGNOSIS — N760 Acute vaginitis: Secondary | ICD-10-CM | POA: Diagnosis present

## 2015-04-20 DIAGNOSIS — B3731 Acute candidiasis of vulva and vagina: Secondary | ICD-10-CM

## 2015-04-20 LAB — POCT URINALYSIS DIP (DEVICE)
Bilirubin Urine: NEGATIVE
Glucose, UA: NEGATIVE mg/dL
KETONES UR: NEGATIVE mg/dL
Nitrite: NEGATIVE
PH: 7 (ref 5.0–8.0)
PROTEIN: NEGATIVE mg/dL
SPECIFIC GRAVITY, URINE: 1.015 (ref 1.005–1.030)
UROBILINOGEN UA: 0.2 mg/dL (ref 0.0–1.0)

## 2015-04-20 LAB — POCT PREGNANCY, URINE: PREG TEST UR: NEGATIVE

## 2015-04-20 MED ORDER — NAPROXEN 500 MG PO TABS: 500.0000 mg | ORAL_TABLET | Freq: Two times a day (BID) | ORAL | Status: DC

## 2015-04-20 MED ORDER — FLUCONAZOLE 150 MG PO TABS: 150.0000 mg | ORAL_TABLET | Freq: Once | ORAL | Status: DC

## 2015-04-20 MED ORDER — DICLOFENAC POTASSIUM 50 MG PO TABS: 50.0000 mg | ORAL_TABLET | Freq: Three times a day (TID) | ORAL | Status: DC

## 2015-04-20 NOTE — Discharge Instructions (Signed)
You have inflammation of some tendons in your ankle. Take diclofenac 3 times a day for the next week, then as needed. Continue to ice as often as he can. Wear your boot if you are going to be doing a lot of walking. Do the exercises I showed you to strengthen your ankle.  The discharge is likely a yeast infection. Take Diflucan today. Repeat the dose in 3 days if symptoms have not resolved.  Follow-up as needed.

## 2015-04-20 NOTE — ED Notes (Signed)
C/o right ankle pain States she feels as if she twisted her foot States she drags foot when she gets out of bed Ice, wearing a boot and compression used as tx   Does have abd pain States she has a discharge

## 2015-04-20 NOTE — ED Provider Notes (Signed)
CSN: [id number]     Arrival date & time 04/20/15  1358 History   First MD Initiated Contact with Patient 04/20/15 1459     Chief Complaint  Patient presents with  . Ankle Pain  . Vaginal Discharge   (Consider location/radiation/quality/duration/timing/severity/associated sxs/prior Treatment) HPI  She is a 29 year old woman here for evaluation of right ankle pain and vaginal discharge. The ankle pain has been present for about a week. It is located medial ankle. She thinks she may have stepped off a curb funny, but does not remember a specific injury. Pain is worse with certain movements of the ankle. She has been doing ice, elevation, and wearing a boot without much improvement.  She reports a vaginal discharge over the last few days. This is associated with some pelvic pressure. Denies itching or pain. No new sexual partners.  Past Medical History  Diagnosis Date  . Nystagmus   . Anemia     sickle cell anemia   Past Surgical History  Procedure Laterality Date  . Cataract extraction    . Cesarean section  2009   Family History  Problem Relation Age of Onset  . Anesthesia problems Neg Hx   . Miscarriages / Stillbirths Sister   . Cancer Maternal Aunt   . Diabetes Maternal Aunt   . Early death Maternal Uncle   . Cataracts Father    History  Substance Use Topics  . Smoking status: Never Smoker   . Smokeless tobacco: Never Used  . Alcohol Use: No   OB History    Gravida Para Term Preterm AB TAB SAB Ectopic Multiple Living   3 2 1 1 1 1  0 0 0 2     Review of Systems As in history of present illness Allergies  Review of patient's allergies indicates no known allergies.  Home Medications   Prior to Admission medications   Medication Sig Start Date End Date Taking? Authorizing Provider  diclofenac (CATAFLAM) 50 MG tablet Take 1 tablet (50 mg total) by mouth 3 (three) times daily. 04/20/15   Melony Overly, MD  fluconazole (DIFLUCAN) 150 MG tablet Take 1 tablet (150 mg  total) by mouth once. Take 2nd pill if symptoms still present in 3 days 04/20/15   Melony Overly, MD   BP 139/94 mmHg  Pulse 98  Resp 16  SpO2 100% Physical Exam  Constitutional: She is oriented to person, place, and time. She appears well-developed and well-nourished. No distress.  Cardiovascular: Normal rate.   Pulmonary/Chest: Effort normal.  Genitourinary: No bleeding in the vagina. No foreign body around the vagina. No signs of injury around the vagina. Vaginal discharge (scant adherent white) found.  Musculoskeletal:  Right ankle: No erythema or edema. She is tender just posterior to the medial malleolus. No bony tenderness. 5 out of 5 strength in the ankle. Pain when stretching of the posterior tibialis tendon. No joint laxity. 2+ DP pulse.  Neurological: She is alert and oriented to person, place, and time.    ED Course  Procedures (including critical care time) Labs Review Labs Reviewed  POCT URINALYSIS DIP (DEVICE) - Abnormal; Notable for the following:    Hgb urine dipstick TRACE (*)    Leukocytes, UA TRACE (*)    All other components within normal limits  POCT PREGNANCY, URINE  CERVICOVAGINAL ANCILLARY ONLY    Imaging Review No results found.   MDM   1. Ankle sprain, right, initial encounter   2. Yeast vaginitis    Conservative management  of ankle with ice, diclofenac, boot. Rehab exercises given. Diflucan for likely yeast vaginitis. Wet prep sent.    Melony Overly, MD 04/20/15 [phone]

## 2015-04-20 NOTE — ED Notes (Signed)
[facility] pharmacy called stating diclofenac is not covered by [facility]

## 2015-04-22 LAB — CERVICOVAGINAL ANCILLARY ONLY: Wet Prep (BD Affirm): POSITIVE — AB

## 2015-04-25 NOTE — ED Notes (Signed)
Lab report positive for yeast, treatment adequate w diflucan, no further action required

## 2015-05-12 ENCOUNTER — Ambulatory Visit (INDEPENDENT_AMBULATORY_CARE_PROVIDER_SITE_OTHER): Payer: Medicaid Other | Admitting: Family Medicine

## 2015-05-12 ENCOUNTER — Encounter: Payer: Self-pay | Admitting: Family Medicine

## 2015-05-12 VITALS — BP 130/75 | HR 108 | Temp 98.4°F | Ht 64.0 in | Wt 210.1 lb

## 2015-05-12 DIAGNOSIS — Z30017 Encounter for initial prescription of implantable subdermal contraceptive: Secondary | ICD-10-CM

## 2015-05-12 DIAGNOSIS — Z30433 Encounter for removal and reinsertion of intrauterine contraceptive device: Secondary | ICD-10-CM

## 2015-05-12 MED ORDER — ETONOGESTREL 68 MG ~~LOC~~ IMPL
68.0000 mg | DRUG_IMPLANT | Freq: Once | SUBCUTANEOUS | Status: AC
  Administered 2015-05-12: 68 mg via SUBCUTANEOUS

## 2015-05-12 NOTE — Addendum Note (Signed)
Addended by: Truett Mainland on: 05/12/2015 09:13 PM   Modules accepted: Level of Service

## 2015-05-12 NOTE — Progress Notes (Signed)
Nexplanon Removal:  Patient given informed consent for removal of her Implanon, time out was performed.  Signed copy in the chart.  Appropriate time out taken. Nexplanon site identified.  Left area prepped in usual sterile fashon. One cc of 1% lidocaine was used to anesthetize the area at the distal end of the implant. A small stab incision was made right beside the implant on the distal portion.  The implanon rod was grasped using hemostats and removed without difficulty.  There was less than 3 cc blood loss. There were no complications.  A small amount of antibiotic ointment and steri-strips were applied over the small incision.  A pressure bandage was applied to reduce any bruising.  The patient tolerated the procedure well and was given post procedure instructions.  Nexplanon Insertion:  Device confirmed in needle, then inserted full length of needle and withdrawn per handbook instructions in the same site as the old one was taken from.  Device palpated by physician and patient.  Pt insertion site covered with guaze and coban.   Minimal blood loss.  Pt tolerated the procedure well.  Both patient and physician confirmed placement by palpating the device.

## 2015-07-26 DIAGNOSIS — H26492 Other secondary cataract, left eye: Secondary | ICD-10-CM | POA: Diagnosis not present

## 2015-07-26 DIAGNOSIS — H44511 Absolute glaucoma, right eye: Secondary | ICD-10-CM | POA: Diagnosis not present

## 2015-08-02 DIAGNOSIS — H5440 Blindness, one eye, unspecified eye: Secondary | ICD-10-CM | POA: Diagnosis not present

## 2015-10-07 DIAGNOSIS — H04221 Epiphora due to insufficient drainage, right lacrimal gland: Secondary | ICD-10-CM | POA: Diagnosis not present

## 2015-10-07 DIAGNOSIS — H04561 Stenosis of right lacrimal punctum: Secondary | ICD-10-CM | POA: Diagnosis not present

## 2016-03-16 DIAGNOSIS — Z3049 Encounter for surveillance of other contraceptives: Secondary | ICD-10-CM | POA: Diagnosis not present

## 2016-03-16 DIAGNOSIS — Z01419 Encounter for gynecological examination (general) (routine) without abnormal findings: Secondary | ICD-10-CM | POA: Diagnosis not present

## 2016-03-16 DIAGNOSIS — Z3046 Encounter for surveillance of implantable subdermal contraceptive: Secondary | ICD-10-CM | POA: Diagnosis not present

## 2016-03-29 DIAGNOSIS — Z01419 Encounter for gynecological examination (general) (routine) without abnormal findings: Secondary | ICD-10-CM | POA: Diagnosis not present

## 2016-03-29 DIAGNOSIS — Z79899 Other long term (current) drug therapy: Secondary | ICD-10-CM | POA: Diagnosis not present

## 2016-04-27 DIAGNOSIS — H5500 Unspecified nystagmus: Secondary | ICD-10-CM | POA: Diagnosis not present

## 2016-04-27 DIAGNOSIS — H44511 Absolute glaucoma, right eye: Secondary | ICD-10-CM | POA: Diagnosis not present

## 2016-07-13 ENCOUNTER — Ambulatory Visit (HOSPITAL_COMMUNITY)
Admission: EM | Admit: 2016-07-13 | Discharge: 2016-07-13 | Disposition: A | Discharge status: Home / Self Care | Payer: Medicare Other | Attending: Physician Assistant | Admitting: Physician Assistant

## 2016-07-13 ENCOUNTER — Encounter (HOSPITAL_COMMUNITY): Payer: Self-pay | Admitting: Emergency Medicine

## 2016-07-13 DIAGNOSIS — H6121 Impacted cerumen, right ear: Secondary | ICD-10-CM

## 2016-07-13 MED ORDER — CARBAMIDE PEROXIDE 6.5 % OT SOLN: OTIC | 2 refills | Status: DC

## 2016-07-13 NOTE — ED Triage Notes (Signed)
The patient presented to the [facility] with a complaint of right ear fullness and hearing deficit x 2 days.

## 2016-07-13 NOTE — ED Provider Notes (Signed)
CSN: [id number]     Arrival date & time 07/13/16  1008 History   First MD Initiated Contact with Patient 07/13/16 1121     Chief Complaint  Patient presents with  . Ear Fullness   (Consider location/radiation/quality/duration/timing/severity/associated sxs/prior Treatment) HPI 30 year old female pain and difficulty hearing out of her right ear for the last several days. She states the pain score is approximately 1 or 2. She has been using peroxide and sweet oil without any resolution of her symptoms. Past Medical History:  Diagnosis Date  . Anemia    sickle cell anemia  . Nystagmus    Past Surgical History:  Procedure Laterality Date  . CATARACT EXTRACTION    . CESAREAN SECTION  2009   Family History  Problem Relation Age of Onset  . Anesthesia problems Neg Hx   . Miscarriages / Stillbirths Sister   . Cancer Maternal Aunt   . Diabetes Maternal Aunt   . Early death Maternal Uncle   . Cataracts Father    Social History  Substance Use Topics  . Smoking status: Never Smoker  . Smokeless tobacco: Never Used  . Alcohol use No   OB History    Gravida Para Term Preterm AB Living   3 2 1 1 1 2    SAB TAB Ectopic Multiple Live Births   0 1 0 0 2     Review of Systems  Denies: HEADACHE, NAUSEA, ABDOMINAL PAIN, CHEST PAIN, CONGESTION, DYSURIA, SHORTNESS OF BREATH  Allergies  Review of patient's allergies indicates no known allergies.  Home Medications   Prior to Admission medications   Medication Sig Start Date End Date Taking? Authorizing Provider  carbamide peroxide (DEBROX) 6.5 % otic solution USE DROPS IN EARS EVERY OTHER Saturday NIGHT SHOWER. 07/13/16   Konrad Felix, PA  naproxen (NAPROSYN) 500 MG tablet Take 1 tablet (500 mg total) by mouth 2 (two) times daily. 04/20/15   Melony Overly, MD   Meds Ordered and Administered this Visit  Medications - No data to display  BP 131/76 (BP Location: Left Arm)   Pulse 68   Temp 98.6 F (37 C) (Oral)   Resp 12   SpO2  100%  No data found.   Physical Exam NURSES NOTES AND VITAL SIGNS REVIEWED. CONSTITUTIONAL: Well developed, well nourished, no acute distress HEENT: normocephalic, atraumatic right TM is obscured from visualization due to impacted cerumen. Left TM is clear. EYES: Conjunctiva normal NECK:normal ROM, supple, no adenopathy PULMONARY:No respiratory distress, normal effort ABDOMINAL: Soft, ND, NT BS+, No CVAT MUSCULOSKELETAL: Normal ROM of all extremities,  SKIN: warm and dry without rash PSYCHIATRIC: Mood and affect, behavior are normal  Urgent Care Course   Clinical Course    Procedures (including critical care time)  Labs Review Labs Reviewed - No data to display  Imaging Review No results found.   Visual Acuity Review  Right Eye Distance:   Left Eye Distance:   Bilateral Distance:    Right Eye Near:   Left Eye Near:    Bilateral Near:       Post irrigation of the right ear patient states that her auditory acuity is back to normal. She has no pain. And the TM is normal to visualization.  MDM   1. Cerumen impaction, right     Patient is reassured that there are no issues that require transfer to higher level of care at this time or additional tests. Patient is advised to continue home symptomatic treatment. Patient is advised  that if there are new or worsening symptoms to attend the emergency department, contact primary care provider, or return to UC. Instructions of care provided discharged home in stable condition.    THIS NOTE WAS GENERATED USING A VOICE RECOGNITION SOFTWARE PROGRAM. ALL REASONABLE EFFORTS  WERE MADE TO PROOFREAD THIS DOCUMENT FOR ACCURACY.  I have verbally reviewed the discharge instructions with the patient. A printed AVS was given to the patient.  All questions were answered prior to discharge.      Konrad Felix, [place] 07/13/16 1954

## 2017-04-30 DIAGNOSIS — H44511 Absolute glaucoma, right eye: Secondary | ICD-10-CM | POA: Diagnosis not present

## 2017-08-05 ENCOUNTER — Ambulatory Visit (HOSPITAL_COMMUNITY)
Admission: EM | Admit: 2017-08-05 | Discharge: 2017-08-05 | Disposition: A | Discharge status: Home / Self Care | Payer: Medicare Other | Attending: Emergency Medicine | Admitting: Emergency Medicine

## 2017-08-05 ENCOUNTER — Encounter (HOSPITAL_COMMUNITY): Payer: Self-pay | Admitting: Emergency Medicine

## 2017-08-05 DIAGNOSIS — L02411 Cutaneous abscess of right axilla: Secondary | ICD-10-CM

## 2017-08-05 MED ORDER — AMOXICILLIN-POT CLAVULANATE 875-125 MG PO TABS: 1.0000 | ORAL_TABLET | Freq: Two times a day (BID) | ORAL | 0 refills | Status: DC

## 2017-08-05 MED ORDER — LIDOCAINE-EPINEPHRINE (PF) 2 %-1:200000 IJ SOLN
INTRAMUSCULAR | Status: AC
  Filled 2017-08-05: qty 20

## 2017-08-05 NOTE — ED Provider Notes (Signed)
[place]    CSN: [id number] Arrival date & time: 08/05/17  1017     History   Chief Complaint Chief Complaint  Patient presents with  . Abscess    HPI Theresa Schroeder is a 31 y.o. female.   31 year old female with history of sickle cell trait, nystagmus comes in for few day history of right axilla abscess. She has noticed abscess increase in size with surrounding erythema. Denies fever, chills, night sweats. Has been soaking abscess in warm water and has noticed self drainage. She states last shaved area a few weeks ago.       Past Medical History:  Diagnosis Date  . Anemia    sickle cell anemia  . Nystagmus     Patient Active Problem List   Diagnosis Date Noted  . Family planning, subdermal contraceptive insertion 04/21/2012  . Nystagmus, congenital 01/25/2012  . Sickle cell trait ([place]) 01/21/2012    Past Surgical History:  Procedure Laterality Date  . CATARACT EXTRACTION    . CESAREAN SECTION  2009    OB History    Gravida Para Term Preterm AB Living   3 2 1 1 1 2    SAB TAB Ectopic Multiple Live Births   0 1 0 0 2       Home Medications    Prior to Admission medications   Medication Sig Start Date End Date Taking? Authorizing Provider  amoxicillin-clavulanate (AUGMENTIN) 875-125 MG tablet Take 1 tablet by mouth every 12 (twelve) hours. 08/05/17   Tasia Catchings, Amy V, PA-C  carbamide peroxide (DEBROX) 6.5 % otic solution USE DROPS IN EARS EVERY OTHER Saturday NIGHT SHOWER. 07/13/16   Konrad Felix, PA  naproxen (NAPROSYN) 500 MG tablet Take 1 tablet (500 mg total) by mouth 2 (two) times daily. 04/20/15   Melony Overly, MD    Family History Family History  Problem Relation Age of Onset  . Miscarriages / Stillbirths Sister   . Cataracts Father   . Cancer Maternal Aunt   . Diabetes Maternal Aunt   . Early death Maternal Uncle   . Anesthesia problems Neg Hx     Social History Social History  Substance Use Topics  . Smoking status: Never  Smoker  . Smokeless tobacco: Never Used  . Alcohol use No     Allergies   Patient has no known allergies.   Review of Systems Review of Systems  Reason unable to perform ROS: See HPI as above.     Physical Exam Triage Vital Signs ED Triage Vitals  Enc Vitals Group     BP 08/05/17 1043 113/73     Pulse Rate 08/05/17 1043 80     Resp 08/05/17 1043 18     Temp 08/05/17 1043 98.2 F (36.8 C)     Temp Source 08/05/17 1043 Oral     SpO2 08/05/17 1043 100 %     Weight --      Height --      Head Circumference --      Peak Flow --      Pain Score 08/05/17 1042 5     Pain Loc --      Pain Edu? --      Excl. in [place]? --    No data found.   Updated Vital Signs BP 113/73 (BP Location: Left Arm) Comment: large cuff  Pulse 80   Temp 98.2 F (36.8 C) (Oral)   Resp 18   SpO2 100%  Physical Exam  Constitutional: She is oriented to person, place, and time. She appears well-developed and well-nourished. No distress.  HENT:  Head: Normocephalic and atraumatic.  Neurological: She is alert and oriented to person, place, and time.  Skin: Skin is warm and dry.  2 cm x 5 cm abscess on right axilla. Surrounding erythema with slight increase in warmth. Small self draining site. Fluctuation palpated.      UC Treatments / Results  Labs (all labs ordered are listed, but only abnormal results are displayed) Labs Reviewed - No data to display  EKG  EKG Interpretation None       Radiology No results found.  Procedures .Marland KitchenIncision and Drainage Date/Time: 08/05/2017 11:55 AM Performed by: Tasia Catchings AMY V Authorized by: Melynda Ripple   Consent:    Consent obtained:  Verbal   Consent given by:  Patient   Risks discussed:  Bleeding, incomplete drainage, infection and pain   Alternatives discussed:  Alternative treatment Location:    Type:  Abscess   Size:  2 x 5 cm   Location:  Upper extremity   Upper extremity location:  Arm   Arm location:  R upper  arm Pre-procedure details:    Skin preparation:  Betadine Anesthesia (see MAR for exact dosages):    Anesthesia method:  Local infiltration   Local anesthetic:  Lidocaine 2% WITH epi Procedure type:    Complexity:  Simple Procedure details:    Needle aspiration: no     Incision types:  Single straight   Incision depth:  Dermal   Scalpel blade:  11   Wound management:  Probed and deloculated   Drainage:  Bloody and purulent   Drainage amount:  Moderate   Wound treatment:  Wound left open   Packing materials:  None Post-procedure details:    Patient tolerance of procedure:  Tolerated well, no immediate complications   (including critical care time)  Medications Ordered in UC Medications - No data to display   Initial Impression / Assessment and Plan / UC Course  I have reviewed the triage vital signs and the nursing notes.  Pertinent labs & imaging results that were available during my care of the patient were reviewed by me and considered in my medical decision making (see chart for details).    Patient tolerated I&D well. Start Augmentin as directed. Wound care instructions given. Return precautions given.   Final Clinical Impressions(s) / UC Diagnoses   Final diagnoses:  Abscess of axilla, right    New Prescriptions New Prescriptions   AMOXICILLIN-CLAVULANATE (AUGMENTIN) 875-125 MG TABLET    Take 1 tablet by mouth every 12 (twelve) hours.      Ok Edwards, PA-C 08/05/17 1158

## 2017-08-05 NOTE — ED Triage Notes (Signed)
Abscess to right axilla.  Noticed this boil on Saturday and has grown rapidly.  Patient says slightly draining with patient squeezing boil

## 2017-08-05 NOTE — Discharge Instructions (Signed)
Take Augmentin as directed. Keep wound clean and dry. Avoid shaving and applying cosmetics. Monitor for any worsening of symptoms, increased size, spreading redness, increased warmth, fever, follow up here or with general surgery for further evaluation.

## 2018-05-02 DIAGNOSIS — H33322 Round hole, left eye: Secondary | ICD-10-CM | POA: Diagnosis not present

## 2018-05-23 ENCOUNTER — Telehealth: Payer: Self-pay | Admitting: Family Medicine

## 2018-05-23 NOTE — Telephone Encounter (Signed)
Patient has expired Nexplanon, and wants to know if it's still doing what it's suppose to do because it is expired. She has been scheduled for next available appointment. She is requestion a call back today.

## 2018-05-27 NOTE — Telephone Encounter (Signed)
Spoke with patient concerning expire nexplanon. I have advised her to use a back up method because her nexplanon is no longer effective. Patient voice understanding.

## 2018-06-02 ENCOUNTER — Encounter (HOSPITAL_COMMUNITY): Payer: Self-pay

## 2018-06-02 ENCOUNTER — Ambulatory Visit (HOSPITAL_COMMUNITY)
Admission: EM | Admit: 2018-06-02 | Discharge: 2018-06-02 | Disposition: A | Discharge status: Home / Self Care | Payer: Medicare Other | Attending: Family Medicine | Admitting: Family Medicine

## 2018-06-02 DIAGNOSIS — Z3202 Encounter for pregnancy test, result negative: Secondary | ICD-10-CM

## 2018-06-02 DIAGNOSIS — R519 Headache, unspecified: Secondary | ICD-10-CM

## 2018-06-02 DIAGNOSIS — R51 Headache: Secondary | ICD-10-CM

## 2018-06-02 DIAGNOSIS — R110 Nausea: Secondary | ICD-10-CM

## 2018-06-02 LAB — POCT PREGNANCY, URINE: PREG TEST UR: NEGATIVE

## 2018-06-02 MED ORDER — METOCLOPRAMIDE HCL 5 MG/ML IJ SOLN
5.0000 mg | Freq: Once | INTRAMUSCULAR | Status: AC
  Administered 2018-06-02: 5 mg via INTRAMUSCULAR

## 2018-06-02 MED ORDER — METOCLOPRAMIDE HCL 5 MG/ML IJ SOLN
INTRAMUSCULAR | Status: AC
  Filled 2018-06-02: qty 2

## 2018-06-02 MED ORDER — KETOROLAC TROMETHAMINE 60 MG/2ML IM SOLN
INTRAMUSCULAR | Status: AC
  Filled 2018-06-02: qty 2

## 2018-06-02 MED ORDER — ONDANSETRON HCL 4 MG PO TABS: 4.0000 mg | ORAL_TABLET | Freq: Three times a day (TID) | ORAL | 0 refills | Status: DC | PRN

## 2018-06-02 MED ORDER — KETOROLAC TROMETHAMINE 60 MG/2ML IM SOLN
60.0000 mg | Freq: Once | INTRAMUSCULAR | Status: AC
  Administered 2018-06-02: 60 mg via INTRAMUSCULAR

## 2018-06-02 NOTE — ED Triage Notes (Signed)
Pt presents with nausea, headache and tingling in right leg that has been ongoing for a few days

## 2018-06-02 NOTE — Discharge Instructions (Signed)
Negative for pregnancy today.  Use of condoms until nexplanon is replaced.  Continue to follow up with your eye surgeon as scheduled as this may also be contributing to symptoms.  Zofran as needed for nausea.  Small frequent snacks to prevent headache.  If any worsening of symptoms, dizziness, worsening of headache, vision loss or change, or otherwise worsening please return or go to the Er.

## 2018-06-02 NOTE — ED Provider Notes (Signed)
[place]    CSN: [id number] Arrival date & time: 06/02/18  7253     History   Chief Complaint Chief Complaint  Patient presents with  . Nausea, Headache, Tingling in right leg    HPI Theresa Schroeder is a 32 y.o. female.   [place] presents with complaints of headache which has been intermittent for the past 2-3 days, present currently. This is not the worst headache she has ever experienced. No dizziness. Was worse when she had not eaten, after eating her pain improved. She has felt intermittently nauseated, no vomiting. No diarrhea or constipation. No abdominal pain. Denies current nausea. Does not feel that the nausea is associated with her headache. Pain 4/10 currently. Took ibuprofen 1 tab this am which did seem to help with headache. She had eye surgery recently, has a follow up with her eye surgeon tomorrow. Has noticed some slight blurring of her vision with reading. 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 She sees [facility]. She states also that her nexplanon expired 7/22. She is sexually active. Last known period 7/16, she states they can be irregular due to implant. She has appointment for replacement of this next month.    ROS per HPI.      Past Medical History:  Diagnosis Date  . Anemia    sickle cell anemia  . Nystagmus     Patient Active Problem List   Diagnosis Date Noted  . Family planning, subdermal contraceptive insertion 04/21/2012  . Nystagmus, congenital 01/25/2012  . Sickle cell trait ([place]) 01/21/2012    Past Surgical History:  Procedure Laterality Date  . CATARACT EXTRACTION    . CESAREAN SECTION  2009    OB History    Gravida  3   Para  2   Term  1   Preterm  1   AB  1   Living  2     SAB  0   TAB  1   Ectopic  0   Multiple  0   Live Births  2            Home Medications    Prior to Admission medications   Medication Sig Start Date End Date Taking?  Authorizing Provider  amoxicillin-clavulanate (AUGMENTIN) 875-125 MG tablet Take 1 tablet by mouth every 12 (twelve) hours. 08/05/17   Tasia Catchings, Amy V, PA-C  carbamide peroxide (DEBROX) 6.5 % otic solution USE DROPS IN EARS EVERY OTHER Saturday NIGHT SHOWER. 07/13/16   Konrad Felix, PA  naproxen (NAPROSYN) 500 MG tablet Take 1 tablet (500 mg total) by mouth 2 (two) times daily. 04/20/15   Melony Overly, MD  ondansetron (ZOFRAN) 4 MG tablet Take 1 tablet (4 mg total) by mouth every 8 (eight) hours as needed for nausea or vomiting. 06/02/18   Zigmund Gottron, NP    Family History Family History  Problem Relation Age of Onset  . Miscarriages / Stillbirths Sister   . Cataracts Father   . Cancer Maternal Aunt   . Diabetes Maternal Aunt   . Early death Maternal Uncle   . Anesthesia problems Neg Hx     Social History Social History   Tobacco Use  . Smoking status: Never Smoker  . Smokeless tobacco: Never Used  Substance Use Topics  . Alcohol use: No  . Drug use: No     Allergies   Patient has no known allergies.   Review of  Systems Review of Systems   Physical Exam Triage Vital Signs ED Triage Vitals  Enc Vitals Group     BP 06/02/18 0824 139/88     Pulse Rate 06/02/18 0824 90     Resp 06/02/18 0824 20     Temp 06/02/18 0824 98.4 F (36.9 C)     Temp Source 06/02/18 0824 Oral     SpO2 06/02/18 0824 100 %     Weight --      Height --      Head Circumference --      Peak Flow --      Pain Score 06/02/18 0823 4     Pain Loc --      Pain Edu? --      Excl. in [place]? --    No data found.  Updated Vital Signs BP 139/88 (BP Location: Right Arm)   Pulse 90   Temp 98.4 F (36.9 C) (Oral)   Resp 20   LMP  (LMP Unknown) Comment: Irregular periods  SpO2 100%   Visual Acuity Right Eye Distance:   Left Eye Distance:   Bilateral Distance:    Right Eye Near:   Left Eye Near:    Bilateral Near:     Physical Exam  Constitutional: She is oriented to person, place, and  time. She appears well-developed and well-nourished. No distress.  Eyes: Lids are normal. Right eye exhibits nystagmus. Left eye exhibits nystagmus. Left pupil is round and reactive.  No right pupil, baseline for patient; baseline nystagmus but still full rom to all fields  Cardiovascular: Normal rate, regular rhythm and normal heart sounds.  Pulmonary/Chest: Effort normal and breath sounds normal.  Abdominal: Soft. There is no tenderness.  Neurological: She is alert and oriented to person, place, and time. She has normal strength. She is not disoriented. No cranial nerve deficit or sensory deficit.  Skin: Skin is warm and dry.     UC Treatments / Results  Labs (all labs ordered are listed, but only abnormal results are displayed) Labs Reviewed  POCT PREGNANCY, URINE    EKG None  Radiology No results found.  Procedures Procedures (including critical care time)  Medications Ordered in UC Medications  ketorolac (TORADOL) injection 60 mg (60 mg Intramuscular Given 06/02/18 0905)  metoCLOPramide (REGLAN) injection 5 mg (5 mg Intramuscular Given 06/02/18 0905)    Initial Impression / Assessment and Plan / UC Course  I have reviewed the triage vital signs and the nursing notes.  Pertinent labs & imaging results that were available during my care of the patient were reviewed by me and considered in my medical decision making (see chart for details).     Without acute neurological findings on exam. Non toxic. Taking PO. Has a follow up with eye surgeon tomorrow, will be helpful to rule out eye etiology as she has extensive eye history. Negative for pregnancy. toradol and reglan provided in clinic today. zofran provided for prn use. Follow up with pcp as needed, keep appointment for nexplanon exchange. Return precautions provided. Patient verbalized understanding and agreeable to plan.  Ambulatory out of clinic without difficulty.    Final Clinical Impressions(s) / UC Diagnoses    Final diagnoses:  Acute nonintractable headache, unspecified headache type     Discharge Instructions     Negative for pregnancy today.  Use of condoms until nexplanon is replaced.  Continue to follow up with your eye surgeon as scheduled as this may also be contributing to symptoms.  Zofran  as needed for nausea.  Small frequent snacks to prevent headache.  If any worsening of symptoms, dizziness, worsening of headache, vision loss or change, or otherwise worsening please return or go to the Er.     ED Prescriptions    Medication Sig Dispense Auth. Provider   ondansetron (ZOFRAN) 4 MG tablet Take 1 tablet (4 mg total) by mouth every 8 (eight) hours as needed for nausea or vomiting. 10 tablet Zigmund Gottron, NP     Controlled Substance Prescriptions [place] Controlled Substance Registry consulted? Not Applicable   Zigmund Gottron, NP 06/02/18 [phone]

## 2018-06-03 DIAGNOSIS — H33322 Round hole, left eye: Secondary | ICD-10-CM | POA: Diagnosis not present

## 2018-06-30 ENCOUNTER — Ambulatory Visit (INDEPENDENT_AMBULATORY_CARE_PROVIDER_SITE_OTHER): Payer: Medicare Other | Admitting: Family Medicine

## 2018-06-30 ENCOUNTER — Other Ambulatory Visit (HOSPITAL_COMMUNITY)
Admission: RE | Admit: 2018-06-30 | Discharge: 2018-06-30 | Disposition: A | Discharge status: Home / Self Care | Payer: Medicare Other | Source: Ambulatory Visit | Attending: Family Medicine | Admitting: Family Medicine

## 2018-06-30 ENCOUNTER — Encounter: Payer: Self-pay | Admitting: Family Medicine

## 2018-06-30 VITALS — BP 129/91 | HR 94 | Wt 216.5 lb

## 2018-06-30 DIAGNOSIS — Z3046 Encounter for surveillance of implantable subdermal contraceptive: Secondary | ICD-10-CM | POA: Diagnosis not present

## 2018-06-30 DIAGNOSIS — Z124 Encounter for screening for malignant neoplasm of cervix: Secondary | ICD-10-CM

## 2018-06-30 DIAGNOSIS — Z30017 Encounter for initial prescription of implantable subdermal contraceptive: Secondary | ICD-10-CM

## 2018-06-30 DIAGNOSIS — Z01419 Encounter for gynecological examination (general) (routine) without abnormal findings: Secondary | ICD-10-CM

## 2018-06-30 LAB — POCT PREGNANCY, URINE: Preg Test, Ur: NEGATIVE

## 2018-06-30 NOTE — Patient Instructions (Signed)
Nexplanon Instructions After Insertion  Keep bandage clean and dry for 24 hours  May use ice/Tylenol/Ibuprofen for soreness or pain  If you develop fever, drainage or increased warmth from incision site-contact office immediately   

## 2018-06-30 NOTE — Progress Notes (Signed)
GYNECOLOGY ANNUAL PREVENTATIVE CARE ENCOUNTER NOTE  Subjective:   Theresa Schroeder is a 32 y.o. [phone] female here for a routine annual gynecologic exam.  Current complaints: none.   Denies abnormal vaginal bleeding, discharge, pelvic pain, problems with intercourse or other gynecologic concerns.    Gynecologic History Patient's last menstrual period was 06/18/2018 (approximate). Patient is sexually active  Contraception: Nexplanon Last Pap: 2013. Results were: normal Last mammogram: n/a.  Obstetric History OB History  Gravida Para Term Preterm AB Living  3 2 1 1 1 2   SAB TAB Ectopic Multiple Live Births  0 1 0 0 2    # Outcome Date GA Lbr Len/2nd Weight Sex Delivery Anes PTL Lv  3 Preterm 01/25/12 [redacted]w[redacted]d 16:00 / 00:27 6 lb 8.8 oz (2.97 kg) M Vag-Spont EPI  LIV  2 TAB 03/03/11             Birth Comments: "alot of pain in legs in pregnancy, stomach area"  1 Term 03/29/08 [redacted]w[redacted]d  8 lb 4 oz (3.742 kg) F CS-LTranv EPI  LIV     Birth Comments: fetal distress    Past Medical History:  Diagnosis Date  . Anemia    sickle cell anemia  . Nystagmus     Past Surgical History:  Procedure Laterality Date  . CATARACT EXTRACTION    . CESAREAN SECTION  2009    Current Outpatient Medications on File Prior to Visit  Medication Sig Dispense Refill  . naproxen (NAPROSYN) 500 MG tablet Take 1 tablet (500 mg total) by mouth 2 (two) times daily. 30 tablet 0  . ondansetron (ZOFRAN) 4 MG tablet Take 1 tablet (4 mg total) by mouth every 8 (eight) hours as needed for nausea or vomiting. 10 tablet 0   No current facility-administered medications on file prior to visit.     No Known Allergies  Social History   Socioeconomic History  . Marital status: Single    Spouse name: Not on file  . Number of children: Not on file  . Years of education: Not on file  . Highest education level: Not on file  Occupational History  . Not on file  Social Needs  . Financial resource strain: Not on  file  . Food insecurity:    Worry: Not on file    Inability: Not on file  . Transportation needs:    Medical: Not on file    Non-medical: Not on file  Tobacco Use  . Smoking status: Never Smoker  . Smokeless tobacco: Never Used  Substance and Sexual Activity  . Alcohol use: No  . Drug use: No  . Sexual activity: Yes    Birth control/protection: Implant    Comment: last intercourse in March  Lifestyle  . Physical activity:    Days per week: Not on file    Minutes per session: Not on file  . Stress: Not on file  Relationships  . Social connections:    Talks on phone: Not on file    Gets together: Not on file    Attends religious service: Not on file    Active member of club or organization: Not on file    Attends meetings of clubs or organizations: Not on file    Relationship status: Not on file  . Intimate partner violence:    Fear of current or ex partner: Not on file    Emotionally abused: Not on file    Physically abused: Not on file  Forced sexual activity: Not on file  Other Topics Concern  . Not on file  Social History Narrative  . Not on file    Family History  Problem Relation Age of Onset  . Miscarriages / Stillbirths Sister   . Cataracts Father   . Cancer Maternal Aunt   . Diabetes Maternal Aunt   . Early death Maternal Uncle   . Anesthesia problems Neg Hx     The following portions of the patient's history were reviewed and updated as appropriate: allergies, current medications, past family history, past medical history, past social history, past surgical history and problem list.  Review of Systems Pertinent items noted in HPI and remainder of comprehensive ROS otherwise negative.   Objective:  BP (!) 129/91   Pulse 94   Wt 216 lb 8 oz (98.2 kg)   LMP 06/18/2018 (Approximate) Comment: Irregular periods  BMI 37.16 kg/m  CONSTITUTIONAL: Well-developed, well-nourished female in no acute distress.  HENT:  Normocephalic, atraumatic, External  right and left ear normal. Oropharynx is clear and moist EYES: Conjunctivae and EOM are normal. Pupils are equal, round, and reactive to light. No scleral icterus.  NECK: Normal range of motion, supple, no masses.  Normal thyroid.   CARDIOVASCULAR: Normal heart rate noted, regular rhythm RESPIRATORY: Clear to auscultation bilaterally. Effort and breath sounds normal, no problems with respiration noted. BREASTS: Symmetric in size. No masses, skin changes, nipple drainage, or lymphadenopathy. ABDOMEN: Soft, normal bowel sounds, no distention noted.  No tenderness, rebound or guarding.  PELVIC: Normal appearing external genitalia; normal appearing vaginal mucosa and cervix.  No abnormal discharge noted.  Pap smear obtained.  Normal uterine size, no other palpable masses, no uterine or adnexal tenderness. MUSCULOSKELETAL: Normal range of motion. No tenderness.  No cyanosis, clubbing, or edema.  2+ distal pulses. SKIN: Skin is warm and dry. No rash noted. Not diaphoretic. No erythema. No pallor. NEUROLOGIC: Alert and oriented to person, place, and time. Normal reflexes, muscle tone coordination. No cranial nerve deficit noted. PSYCHIATRIC: Normal mood and affect. Normal behavior. Normal judgment and thought content.  Nexplanon Removal:  Patient given informed consent for removal of her Implanon, time out was performed.  Signed copy in the chart.  Appropriate time out taken. Implanon site identified.  Area prepped in usual sterile fashon. One cc of 1% lidocaine was used to anesthetize the area at the distal end of the implant. A small stab incision was made right beside the implant on the distal portion.  The implanon rod was grasped using hemostats and removed without difficulty.  There was less than 3 cc blood loss. There were no complications.  A small amount of antibiotic ointment and steri-strips were applied over the small incision.  A pressure bandage was applied to reduce any bruising.  The patient  tolerated the procedure well and was given post procedure instructions.   Nexplanon Insertion:  Patient given informed consent, signed copy in the chart, time out was performed. Pregnancy test was negative. Nexplanon removed form packaging,  Device confirmed in needle, then inserted full length of needle and withdrawn per handbook instructions.  Device palpated by physician and patient.  Pt insertion site covered with pressure dressing.   Minimal blood loss.  Pt tolerated the procedure well.      Assessment:  Annual gynecologic examination with pap smear   Plan:  1. Well Woman Exam Will follow up results of pap smear and manage accordingly. STD testing discussed. Patient requested testing   Routine preventative  health maintenance measures emphasized. Please refer to After Visit Summary for other counseling recommendations.    Loma Boston, [place] for [employer]

## 2018-07-03 ENCOUNTER — Other Ambulatory Visit: Payer: Self-pay | Admitting: Family Medicine

## 2018-07-03 LAB — CYTOLOGY - PAP
Chlamydia: NEGATIVE
DIAGNOSIS: NEGATIVE
HPV: DETECTED — AB
NEISSERIA GONORRHEA: NEGATIVE

## 2018-07-03 MED ORDER — METRONIDAZOLE 500 MG PO TABS: 500.0000 mg | ORAL_TABLET | Freq: Two times a day (BID) | ORAL | 0 refills | Status: DC

## 2018-07-09 ENCOUNTER — Encounter: Payer: Self-pay | Admitting: *Deleted

## 2018-09-30 ENCOUNTER — Ambulatory Visit (HOSPITAL_COMMUNITY)
Admission: EM | Admit: 2018-09-30 | Discharge: 2018-09-30 | Disposition: A | Discharge status: Home / Self Care | Payer: Medicare Other | Attending: Physician Assistant | Admitting: Physician Assistant

## 2018-09-30 ENCOUNTER — Encounter (HOSPITAL_COMMUNITY): Payer: Self-pay

## 2018-09-30 DIAGNOSIS — D571 Sickle-cell disease without crisis: Secondary | ICD-10-CM | POA: Insufficient documentation

## 2018-09-30 DIAGNOSIS — Z79899 Other long term (current) drug therapy: Secondary | ICD-10-CM | POA: Diagnosis not present

## 2018-09-30 DIAGNOSIS — Z791 Long term (current) use of non-steroidal anti-inflammatories (NSAID): Secondary | ICD-10-CM | POA: Insufficient documentation

## 2018-09-30 DIAGNOSIS — N939 Abnormal uterine and vaginal bleeding, unspecified: Secondary | ICD-10-CM | POA: Diagnosis not present

## 2018-09-30 NOTE — ED Provider Notes (Signed)
[facility]    CSN: [id number] Arrival date & time: 09/30/18  4098     History   Chief Complaint Chief Complaint  Patient presents with  . Vaginal Bleeding    Abnormal    HPI Theresa Schroeder is a 32 y.o. female.   32 year old female comes in for irregular vaginal bleeding/spotting. States she had her cycle 11/26-11/29, but continued to have bleeding and spotting since then. She had a new nexplanon insert 06/2018, and this is the first cycle she has had since then. States she has had nexplanon twice in the past without similar symptoms. She denies abdominal pain. Mild nausea without vomiting. Denies vaginal discharge, itching, irritation. Unsure if there is vaginal odor. Denies urinary symptoms such as frequency, dysuria, hematuria. Denies weakness, dizziness, syncope. Denies fever, chills, night sweats. Sexually active with one female partner.      Past Medical History:  Diagnosis Date  . Anemia    sickle cell anemia  . Nystagmus     Patient Active Problem List   Diagnosis Date Noted  . Family planning, subdermal contraceptive insertion 04/21/2012  . Nystagmus, congenital 01/25/2012  . Sickle cell trait ([place]) 01/21/2012    Past Surgical History:  Procedure Laterality Date  . CATARACT EXTRACTION    . CESAREAN SECTION  2009    OB History    Gravida  3   Para  2   Term  1   Preterm  1   AB  1   Living  2     SAB  0   TAB  1   Ectopic  0   Multiple  0   Live Births  2            Home Medications    Prior to Admission medications   Medication Sig Start Date End Date Taking? Authorizing Provider  naproxen (NAPROSYN) 500 MG tablet Take 1 tablet (500 mg total) by mouth 2 (two) times daily. 04/20/15   Melony Overly, MD  ondansetron (ZOFRAN) 4 MG tablet Take 1 tablet (4 mg total) by mouth every 8 (eight) hours as needed for nausea or vomiting. 06/02/18   Zigmund Gottron, NP    Family History Family History  Problem Relation Age of  Onset  . Miscarriages / Stillbirths Sister   . Cataracts Father   . Cancer Maternal Aunt   . Diabetes Maternal Aunt   . Early death Maternal Uncle   . Anesthesia problems Neg Hx     Social History Social History   Tobacco Use  . Smoking status: Never Smoker  . Smokeless tobacco: Never Used  Substance Use Topics  . Alcohol use: No  . Drug use: No     Allergies   Patient has no known allergies.   Review of Systems Review of Systems  Reason unable to perform ROS: See HPI as above.     Physical Exam Triage Vital Signs ED Triage Vitals  Enc Vitals Group     BP 09/30/18 1041 130/90     Pulse Rate 09/30/18 1041 81     Resp 09/30/18 1041 20     Temp 09/30/18 1041 99.2 F (37.3 C)     Temp Source 09/30/18 1041 Oral     SpO2 09/30/18 1041 99 %     Weight --      Height --      Head Circumference --      Peak Flow --  Pain Score 09/30/18 1040 0     Pain Loc --      Pain Edu? --      Excl. in [place]? --    No data found.  Updated Vital Signs BP 130/90 (BP Location: Left Arm)   Pulse 81   Temp 99.2 F (37.3 C) (Oral)   Resp 20   LMP 09/17/2018   SpO2 99%   Physical Exam  Constitutional: She is oriented to person, place, and time. She appears well-developed and well-nourished. No distress.  HENT:  Head: Normocephalic and atraumatic.  Eyes: Pupils are equal, round, and reactive to light. Conjunctivae are normal.  Cardiovascular: Normal rate, regular rhythm and normal heart sounds. Exam reveals no gallop and no friction rub.  No murmur heard. Pulmonary/Chest: Effort normal and breath sounds normal. She has no wheezes. She has no rales.  Abdominal: Soft. Bowel sounds are normal. She exhibits no mass. There is no tenderness. There is no rebound, no guarding and no CVA tenderness.  Neurological: She is alert and oriented to person, place, and time.  Skin: Skin is warm and dry.  Psychiatric: She has a normal mood and affect. Her behavior is normal. Judgment  normal.     UC Treatments / Results  Labs (all labs ordered are listed, but only abnormal results are displayed) Labs Reviewed  CERVICOVAGINAL ANCILLARY ONLY    EKG None  Radiology No results found.  Procedures Procedures (including critical care time)  Medications Ordered in UC Medications - No data to display  Initial Impression / Assessment and Plan / UC Course  I have reviewed the triage vital signs and the nursing notes.  Pertinent labs & imaging results that were available during my care of the patient were reviewed by me and considered in my medical decision making (see chart for details).    Discussed new nexplanon could be contributing/causing vaginal bleeding/spotting. Will send cytology and have patient monitor. Return precautions given. Patient expresses understanding and agrees to plan.  Final Clinical Impressions(s) / UC Diagnoses   Final diagnoses:  Abnormal uterine bleeding    ED Prescriptions    None        Ok Edwards, PA-C 09/30/18 1128

## 2018-09-30 NOTE — ED Triage Notes (Signed)
Pt presents with abnormal vaginal bleeding past her period.

## 2018-09-30 NOTE — ED Notes (Signed)
Pt requested to be STD Tested , having pt do a vaginal swab

## 2018-09-30 NOTE — Discharge Instructions (Signed)
As discussed, bleeding/spotting could be from new Nexplanon. We will do cytology testing, as some infections may cause increase spotting, such as Bv. Please follow up with your GYN if symptoms not improving. Monitor for weakness, dizziness, fast heart rate/heart pounding out of your chest, passing out, go to the emergency department for further evaluation needed.

## 2018-10-02 ENCOUNTER — Telehealth (HOSPITAL_COMMUNITY): Payer: Self-pay | Admitting: Emergency Medicine

## 2018-10-02 LAB — CERVICOVAGINAL ANCILLARY ONLY
BACTERIAL VAGINITIS: POSITIVE — AB
CANDIDA VAGINITIS: NEGATIVE
Chlamydia: NEGATIVE
NEISSERIA GONORRHEA: NEGATIVE
Trichomonas: NEGATIVE

## 2018-10-02 MED ORDER — METRONIDAZOLE 500 MG PO TABS: 500.0000 mg | ORAL_TABLET | Freq: Two times a day (BID) | ORAL | 0 refills | Status: DC

## 2018-10-02 NOTE — Telephone Encounter (Signed)
Bacterial vaginosis is positive. This was not treated at the urgent care visit.  Flagyl 500 mg BID x 7 days #14 no refills sent to patients pharmacy of choice.    Attempted to reach patient. No answer at this time. Voicemail left.    

## 2018-10-04 ENCOUNTER — Telehealth (HOSPITAL_COMMUNITY): Payer: Self-pay | Admitting: Emergency Medicine

## 2018-10-04 NOTE — Telephone Encounter (Signed)
Pt called back, given results, will pick up prescription today. All questions answered.

## 2018-10-04 NOTE — Telephone Encounter (Signed)
Attempted to reach patient, mother answered, stated she would have pt call back asap.

## 2018-10-28 ENCOUNTER — Ambulatory Visit (INDEPENDENT_AMBULATORY_CARE_PROVIDER_SITE_OTHER): Payer: Medicare Other

## 2018-10-28 ENCOUNTER — Other Ambulatory Visit: Payer: Self-pay

## 2018-10-28 ENCOUNTER — Encounter (HOSPITAL_COMMUNITY): Payer: Self-pay | Admitting: Emergency Medicine

## 2018-10-28 ENCOUNTER — Ambulatory Visit (HOSPITAL_COMMUNITY)
Admission: EM | Admit: 2018-10-28 | Discharge: 2018-10-28 | Disposition: A | Discharge status: Home / Self Care | Payer: Medicare Other | Attending: Emergency Medicine | Admitting: Emergency Medicine

## 2018-10-28 DIAGNOSIS — R05 Cough: Secondary | ICD-10-CM | POA: Diagnosis not present

## 2018-10-28 DIAGNOSIS — J22 Unspecified acute lower respiratory infection: Secondary | ICD-10-CM | POA: Diagnosis not present

## 2018-10-28 MED ORDER — AZITHROMYCIN 250 MG PO TABS: 250.0000 mg | ORAL_TABLET | Freq: Every day | ORAL | 0 refills | Status: DC

## 2018-10-28 MED ORDER — PSEUDOEPH-BROMPHEN-DM 30-2-10 MG/5ML PO SYRP: 5.0000 mL | ORAL_SOLUTION | Freq: Four times a day (QID) | ORAL | 0 refills | Status: DC | PRN

## 2018-10-28 MED ORDER — FLUTICASONE PROPIONATE 50 MCG/ACT NA SUSP: 1.0000 | Freq: Every day | NASAL | 0 refills | Status: DC

## 2018-10-28 MED ORDER — HYDROCODONE-HOMATROPINE 5-1.5 MG/5ML PO SYRP: 5.0000 mL | ORAL_SOLUTION | Freq: Every evening | ORAL | 0 refills | Status: DC | PRN

## 2018-10-28 NOTE — ED Triage Notes (Signed)
PT reports she was sick with what she thought was the flu a few weeks ago. PT improved. For the last week PT has developed a worsening cough.

## 2018-10-28 NOTE — Discharge Instructions (Signed)
NO pneumonia seen on Xray Please begin azithromycin-2 tablets today, 1 tablet for the following 4 Please use brompheniramine cough syrup as needed for cough during the day Hycodan cough syrup at bedtime.  This will cause drowsiness, do not drive or work after taking Please use Flonase nasal spray 1 to 2 sprays in each nostril daily to help with nasal congestion and swelling in nose  Drink plenty of fluids  Please follow-up if symptoms continuing do not resolve, worsening, developing fever

## 2018-10-28 NOTE — ED Provider Notes (Signed)
[facility]    CSN: [id number] Arrival date & time: 10/28/18  0815     History   Chief Complaint Chief Complaint  Patient presents with  . Cough    HPI Theresa Schroeder is a 33 y.o. female history of sickle cell anemia, nystagmus, presenting today for evaluation of a cough.  Patient states that her symptoms began around Christmas.  She thought she had the flu as she had fever for few days.  Symptoms slightly improved, but over the last week symptoms have worsened again and has developed a persistent cough.  Cough is occasionally productive.  She has had associated nasal congestion/rhinorrhea.  She denies ear pain or sore throat.  Denies persistent fevers.  She has been taking over-the-counter Mucinex as well as NyQuil and Tylenol with mild relief of symptoms.  She denies any GI upset with this-denies nausea, vomiting diarrhea and abdominal pain.  Is starting to gain her appetite back.  Requesting HIV testing, no specific concerns or symptoms. HPI  Past Medical History:  Diagnosis Date  . Anemia    sickle cell anemia  . Nystagmus     Patient Active Problem List   Diagnosis Date Noted  . Family planning, subdermal contraceptive insertion 04/21/2012  . Nystagmus, congenital 01/25/2012  . Sickle cell trait ([place]) 01/21/2012    Past Surgical History:  Procedure Laterality Date  . CATARACT EXTRACTION    . CESAREAN SECTION  2009    OB History    Gravida  3   Para  2   Term  1   Preterm  1   AB  1   Living  2     SAB  0   TAB  1   Ectopic  0   Multiple  0   Live Births  2            Home Medications    Prior to Admission medications   Medication Sig Start Date End Date Taking? Authorizing Provider  azithromycin (ZITHROMAX) 250 MG tablet Take 1 tablet (250 mg total) by mouth daily. Take first 2 tablets together, then 1 every day until finished. 10/28/18   Tovia Kisner C, PA-C  brompheniramine-pseudoephedrine-DM 30-2-10 MG/5ML syrup Take 5  mLs by mouth 4 (four) times daily as needed. Cough during the day 10/28/18   Amita Atayde C, PA-C  fluticasone (FLONASE) 50 MCG/ACT nasal spray Place 1-2 sprays into both nostrils daily for 7 days. 10/28/18 11/04/18  Sheril Hammond C, PA-C  HYDROcodone-homatropine (HYCODAN) 5-1.5 MG/5ML syrup Take 5 mLs by mouth at bedtime as needed for cough. 10/28/18   Dalayla Aldredge C, PA-C  metroNIDAZOLE (FLAGYL) 500 MG tablet Take 1 tablet (500 mg total) by mouth 2 (two) times daily. 10/02/18   Raylene Everts, MD  naproxen (NAPROSYN) 500 MG tablet Take 1 tablet (500 mg total) by mouth 2 (two) times daily. 04/20/15   Melony Overly, MD  ondansetron (ZOFRAN) 4 MG tablet Take 1 tablet (4 mg total) by mouth every 8 (eight) hours as needed for nausea or vomiting. 06/02/18   Zigmund Gottron, NP    Family History Family History  Problem Relation Age of Onset  . Miscarriages / Stillbirths Sister   . Cataracts Father   . Cancer Maternal Aunt   . Diabetes Maternal Aunt   . Early death Maternal Uncle   . Anesthesia problems Neg Hx     Social History Social History   Tobacco Use  . Smoking status: Never  Smoker  . Smokeless tobacco: Never Used  Substance Use Topics  . Alcohol use: No  . Drug use: No     Allergies   Patient has no known allergies.   Review of Systems Review of Systems  Constitutional: Negative for activity change, appetite change, chills, fatigue and fever.  HENT: Positive for congestion and rhinorrhea. Negative for ear pain, sinus pressure, sore throat and trouble swallowing.   Eyes: Negative for discharge and redness.  Respiratory: Positive for cough. Negative for chest tightness and shortness of breath.   Cardiovascular: Negative for chest pain.  Gastrointestinal: Negative for abdominal pain, diarrhea, nausea and vomiting.  Musculoskeletal: Negative for myalgias.  Skin: Negative for rash.  Neurological: Negative for dizziness, light-headedness and headaches.     Physical  Exam Triage Vital Signs ED Triage Vitals  Enc Vitals Group     BP 10/28/18 0839 119/87     Pulse Rate 10/28/18 0835 92     Resp 10/28/18 0835 18     Temp 10/28/18 0835 99.4 F (37.4 C)     Temp Source 10/28/18 0835 Oral     SpO2 10/28/18 0835 100 %     Weight --      Height --      Head Circumference --      Peak Flow --      Pain Score 10/28/18 0838 4     Pain Loc --      Pain Edu? --      Excl. in [place]? --    No data found.  Updated Vital Signs BP 119/87   Pulse 92   Temp 99.4 F (37.4 C) (Oral)   Resp 18   SpO2 100%   Visual Acuity Right Eye Distance:   Left Eye Distance:   Bilateral Distance:    Right Eye Near:   Left Eye Near:    Bilateral Near:     Physical Exam Vitals signs and nursing note reviewed.  Constitutional:      General: She is not in acute distress.    Appearance: She is well-developed.  HENT:     Head: Normocephalic and atraumatic.     Ears:     Comments: Bilateral ears without tenderness to palpation of external auricle, tragus and mastoid, EAC's without erythema or swelling, TM's with good bony landmarks and cone of light. Non erythematous.    Nose:     Comments: Nasal mucosa erythematous with swollen turbinates bilaterally, no rhinorrhea present    Mouth/Throat:     Comments: Oral mucosa pink and moist, no tonsillar enlargement or exudate. Posterior pharynx patent and nonerythematous, no uvula deviation or swelling. Normal phonation.  Eyes:     Conjunctiva/sclera: Conjunctivae normal.     Comments: Bilateral horizontal nystagmus, persistent  Neck:     Musculoskeletal: Neck supple.  Cardiovascular:     Rate and Rhythm: Normal rate and regular rhythm.     Heart sounds: No murmur.  Pulmonary:     Effort: Pulmonary effort is normal. No respiratory distress.     Breath sounds: Normal breath sounds.     Comments: Breathing comfortably at rest, CTABL, no wheezing, rales or other adventitious sounds auscultated Abdominal:     Palpations:  Abdomen is soft.     Tenderness: There is no abdominal tenderness.  Skin:    General: Skin is warm and dry.  Neurological:     Mental Status: She is alert.      UC Treatments / Results  Labs (all  labs ordered are listed, but only abnormal results are displayed) Labs Reviewed  HIV ANTIBODY (ROUTINE TESTING W REFLEX)    EKG None  Radiology Dg Chest 2 View  Result Date: 10/28/2018 CLINICAL DATA:  Cough for 2 weeks EXAM: CHEST - 2 VIEW COMPARISON:  None. FINDINGS: Cardiac shadows within normal limits. Lungs are well aerated bilaterally without focal infiltrate or sizable effusion. A portion of the stomach lies above the diaphragmatic margin likely related to hiatal hernia. No bony abnormality is noted. IMPRESSION: Hiatal hernia. No acute abnormality noted. Electronically Signed   By: Inez Catalina M.D.   On: 10/28/2018 09:18    Procedures Procedures (including critical care time)  Medications Ordered in UC Medications - No data to display  Initial Impression / Assessment and Plan / UC Course  I have reviewed the triage vital signs and the nursing notes.  Pertinent labs & imaging results that were available during my care of the patient were reviewed by me and considered in my medical decision making (see chart for details).     Chest x-ray negative, will still cover for atypical respiratory illness with azithromycin.  Will provide cough syrup to use during the day as well as Hycodan at nighttime.  Discussed drowsiness regarding Hycodan advised only use at bedtime do not drive or work after taking.  Flonase nasal spray to help with congestion.  Push fluids.  Discussed that cough may linger.  Continue to monitor symptoms, breathing, temperature,Discussed strict return precautions. Patient verbalized understanding and is agreeable with plan.  Final Clinical Impressions(s) / UC Diagnoses   Final diagnoses:  Lower respiratory infection (e.g., bronchitis, pneumonia, pneumonitis,  pulmonitis)     Discharge Instructions     NO pneumonia seen on Xray Please begin azithromycin-2 tablets today, 1 tablet for the following 4 Please use brompheniramine cough syrup as needed for cough during the day Hycodan cough syrup at bedtime.  This will cause drowsiness, do not drive or work after taking Please use Flonase nasal spray 1 to 2 sprays in each nostril daily to help with nasal congestion and swelling in nose  Drink plenty of fluids  Please follow-up if symptoms continuing do not resolve, worsening, developing fever    ED Prescriptions    Medication Sig Dispense Auth. Provider   azithromycin (ZITHROMAX) 250 MG tablet Take 1 tablet (250 mg total) by mouth daily. Take first 2 tablets together, then 1 every day until finished. 6 tablet Deicy Rusk C, PA-C   HYDROcodone-homatropine (HYCODAN) 5-1.5 MG/5ML syrup Take 5 mLs by mouth at bedtime as needed for cough. 60 mL Stamatia Masri C, PA-C   brompheniramine-pseudoephedrine-DM 30-2-10 MG/5ML syrup Take 5 mLs by mouth 4 (four) times daily as needed. Cough during the day 120 mL Joey Hudock C, PA-C   fluticasone (FLONASE) 50 MCG/ACT nasal spray Place 1-2 sprays into both nostrils daily for 7 days. 1 g Novalynn Branaman, [place] C, PA-C     Controlled Substance Prescriptions [place] Controlled Substance Registry consulted? Not Applicable   Janith Lima, [place] 10/28/18 [phone]

## 2018-10-29 LAB — HIV ANTIBODY (ROUTINE TESTING W REFLEX): HIV Screen 4th Generation wRfx: NONREACTIVE

## 2018-12-05 DIAGNOSIS — H33322 Round hole, left eye: Secondary | ICD-10-CM | POA: Diagnosis not present

## 2019-03-06 DIAGNOSIS — H33322 Round hole, left eye: Secondary | ICD-10-CM | POA: Diagnosis not present

## 2019-06-12 ENCOUNTER — Encounter (HOSPITAL_COMMUNITY): Payer: Self-pay

## 2019-06-12 ENCOUNTER — Ambulatory Visit (HOSPITAL_COMMUNITY)
Admission: EM | Admit: 2019-06-12 | Discharge: 2019-06-12 | Disposition: A | Discharge status: Home / Self Care | Payer: Medicare Other | Attending: Family Medicine | Admitting: Family Medicine

## 2019-06-12 ENCOUNTER — Other Ambulatory Visit: Payer: Self-pay

## 2019-06-12 DIAGNOSIS — G43909 Migraine, unspecified, not intractable, without status migrainosus: Secondary | ICD-10-CM | POA: Diagnosis not present

## 2019-06-12 DIAGNOSIS — Z3202 Encounter for pregnancy test, result negative: Secondary | ICD-10-CM

## 2019-06-12 LAB — POCT URINALYSIS DIP (DEVICE)
Bilirubin Urine: NEGATIVE
Glucose, UA: NEGATIVE mg/dL
Ketones, ur: NEGATIVE mg/dL
Leukocytes,Ua: NEGATIVE
Nitrite: NEGATIVE
Protein, ur: NEGATIVE mg/dL
Specific Gravity, Urine: >=1.03 (ref 1.005–1.030)
Urobilinogen, UA: 0.2 mg/dL (ref 0.0–1.0)
pH: 6 (ref 5.0–8.0)

## 2019-06-12 LAB — POCT PREGNANCY, URINE: Preg Test, Ur: NEGATIVE

## 2019-06-12 MED ORDER — ONDANSETRON 4 MG PO TBDP
4.0000 mg | ORAL_TABLET | Freq: Once | ORAL | Status: AC
  Administered 2019-06-12: 4 mg via ORAL

## 2019-06-12 MED ORDER — KETOROLAC TROMETHAMINE 60 MG/2ML IM SOLN
INTRAMUSCULAR | Status: AC
  Filled 2019-06-12: qty 2

## 2019-06-12 MED ORDER — KETOROLAC TROMETHAMINE 60 MG/2ML IM SOLN
60.0000 mg | Freq: Once | INTRAMUSCULAR | Status: AC
Start: 2019-06-12 — End: 2019-06-12
  Administered 2019-06-12: 60 mg via INTRAMUSCULAR

## 2019-06-12 MED ORDER — ONDANSETRON 4 MG PO TBDP
ORAL_TABLET | ORAL | Status: AC
  Filled 2019-06-12: qty 1

## 2019-06-12 NOTE — Discharge Instructions (Signed)
May take a benadryl once home as well, which may contribute to easing your symptoms.  Go in quiet dark room and rest.  May repeat ibuprofen in another 8 hours if needed. May repeat tylenol if needed.  Please establish care with a primary care provider for follow up as needed.  Any worsening of headache or symptoms please return to be seen or go to the ER.

## 2019-06-12 NOTE — ED Triage Notes (Addendum)
Patient presents to Urgent Care with complaints of frontal migraine since 3 days ago. Patient reports she has been taking tylenol and ibuprofen at home with no relief.  Pt adds at completion of triage that she would also like STD testing today, endorses unprotected intercourse and burning with urination as well as changes in vaginal discharge.

## 2019-06-12 NOTE — ED Provider Notes (Signed)
[place]    CSN: [id number] Arrival date & time: 06/12/19  [id number]      History   Chief Complaint Chief Complaint  Patient presents with  . Migraine  . SEXUALLY TRANSMITTED DISEASE    HPI Theresa Schroeder is a 33 y.o. female.   Theresa Schroeder presents with complaints of headache which started approximately three days ago. Feels similar to other has she has had in the past. 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 She has tried tylenol and ibuprofen which have not helped. Pain is to the frontal head, is a pinching sensation. No light or sound sensitivity. No dizziness. No vision changes. Mild nausea, no vomiting. No fever. No URI symptoms. Denies vaginal symptoms, states she feels she may have had pain with urination 2 weeks ago, but this has resolved. LMP approximately 2 months ago, which can be typical for her as she has an implant for birth control. 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     ROS per HPI, negative if not otherwise mentioned.      Past Medical History:  Diagnosis Date  . Anemia    sickle cell anemia  . Nystagmus     Patient Active Problem List   Diagnosis Date Noted  . Family planning, subdermal contraceptive insertion 04/21/2012  . Nystagmus, congenital 01/25/2012  . Sickle cell trait ([place]) 01/21/2012    Past Surgical History:  Procedure Laterality Date  . CATARACT EXTRACTION    . CESAREAN SECTION  2009    OB History    Gravida  3   Para  2   Term  1   Preterm  1   AB  1   Living  2     SAB  0   TAB  1   Ectopic  0   Multiple  0   Live Births  2            Home Medications    Prior to Admission medications   Medication Sig Start Date End Date Taking? Authorizing Provider  fluticasone (FLONASE) 50 MCG/ACT nasal spray Place 1-2 sprays into both nostrils daily for 7 days. 10/28/18 06/12/19  Wieters, Elesa Hacker, PA-C    Family  History Family History  Problem Relation Age of Onset  . Healthy Mother   . Miscarriages / Stillbirths Sister   . Cataracts Father   . Cancer Maternal Aunt   . Diabetes Maternal Aunt   . Early death Maternal Uncle   . Anesthesia problems Neg Hx     Social History Social History   Tobacco Use  . Smoking status: Never Smoker  . Smokeless tobacco: Never Used  Substance Use Topics  . Alcohol use: No  . Drug use: No     Allergies   Patient has no known allergies.   Review of Systems Review of Systems   Physical Exam Triage Vital Signs ED Triage Vitals  Enc Vitals Group     BP 06/12/19 0836 128/89     Pulse Rate 06/12/19 0836 88     Resp 06/12/19 0836 16     Temp 06/12/19 0836 98.3 F (36.8 C)     Temp Source 06/12/19 0836 Temporal     SpO2 06/12/19 0836 99 %     Weight --      Height --      Head Circumference --  Peak Flow --      Pain Score 06/12/19 0834 4     Pain Loc --      Pain Edu? --      Excl. in [place]? --    No data found.  Updated Vital Signs BP 128/89 (BP Location: Left Arm)   Pulse 88   Temp 98.3 F (36.8 C) (Temporal)   Resp 16   SpO2 99%   Physical Exam Constitutional:      General: She is not in acute distress.    Appearance: She is well-developed.  Cardiovascular:     Rate and Rhythm: Normal rate.  Pulmonary:     Effort: Pulmonary effort is normal.  Abdominal:     Tenderness: There is no abdominal tenderness.  Skin:    General: Skin is warm and dry.  Neurological:     Mental Status: She is alert and oriented to person, place, and time.     Cranial Nerves: No cranial nerve deficit or facial asymmetry.     Sensory: Sensation is intact.     Motor: Motor function is intact.     Coordination: Coordination is intact.     Gait: Gait is intact.     Comments: Chronic nystagmus bilaterally with altered/missing pupil to right at baseline       UC Treatments / Results  Labs (all labs ordered are listed, but only abnormal results  are displayed) Labs Reviewed  POCT URINALYSIS DIP (DEVICE) - Abnormal; Notable for the following components:      Result Value   Hgb urine dipstick TRACE (*)    All other components within normal limits  POC URINE PREG, ED  POCT PREGNANCY, URINE  CERVICOVAGINAL ANCILLARY ONLY    EKG   Radiology No results found.  Procedures Procedures (including critical care time)  Medications Ordered in UC Medications  ketorolac (TORADOL) injection 60 mg (60 mg Intramuscular Given 06/12/19 0926)  ondansetron (ZOFRAN-ODT) disintegrating tablet 4 mg (4 mg Oral Given 06/12/19 0926)  ondansetron (ZOFRAN-ODT) 4 MG disintegrating tablet (has no administration in time range)  ketorolac (TORADOL) 60 MG/2ML injection (has no administration in time range)    Initial Impression / Assessment and Plan / UC Course  I have reviewed the triage vital signs and the nursing notes.  Pertinent labs & imaging results that were available during my care of the patient were reviewed by me and considered in my medical decision making (see chart for details).     No red flag findings. This is not the worst headache of her life, she is not toxic in any way. Urine reassuring. toradol provided to help with headache. Noted elevated specific gravity, encouraged increased hydration. Encouraged establish with PCP. Return precautions provided. Patient verbalized understanding and agreeable to plan.  Ambulatory out of clinic without difficulty.    Final Clinical Impressions(s) / UC Diagnoses   Final diagnoses:  Migraine without status migrainosus, not intractable, unspecified migraine type     Discharge Instructions     May take a benadryl once home as well, which may contribute to easing your symptoms.  Go in quiet dark room and rest.  May repeat ibuprofen in another 8 hours if needed. May repeat tylenol if needed.  Please establish care with a primary care provider for follow up as needed.  Any worsening of headache  or symptoms please return to be seen or go to the ER.    ED Prescriptions    None     Controlled Substance  Prescriptions [place] Controlled Substance Registry consulted? Not Applicable   Zigmund Gottron, NP 06/12/19 1052

## 2019-06-16 LAB — CERVICOVAGINAL ANCILLARY ONLY
Bacterial vaginitis: NEGATIVE
Candida vaginitis: POSITIVE — AB
Chlamydia: POSITIVE — AB
Neisseria Gonorrhea: NEGATIVE
Trichomonas: NEGATIVE

## 2019-06-17 ENCOUNTER — Telehealth (HOSPITAL_COMMUNITY): Payer: Self-pay | Admitting: Emergency Medicine

## 2019-06-17 MED ORDER — FLUCONAZOLE 150 MG PO TABS: 150.0000 mg | ORAL_TABLET | Freq: Once | ORAL | 0 refills | Status: AC

## 2019-06-17 MED ORDER — AZITHROMYCIN 250 MG PO TABS: 1000.0000 mg | ORAL_TABLET | Freq: Once | ORAL | 0 refills | Status: AC

## 2019-06-17 NOTE — Telephone Encounter (Signed)
Chlamydia is positive.  Rx po zithromax 1g #1 dose no refills was sent to the pharmacy of record.  Pt needs education to please refrain from sexual intercourse for 7 days to give the medicine time to work, sexual partners need to be notified and tested/treated.  Condoms may reduce risk of reinfection.  Recheck or followup with PCP for further evaluation if symptoms are not improving.   [facility] notified.  Test for candida (yeast) was positive.  Prescription for fluconazole 150mg  po now, repeat dose in 3d if needed, #2 no refills, sent to the pharmacy of record.  Recheck or followup with PCP for further evaluation if symptoms are not improving.    Patient contacted and made aware of    results, all questions answered

## 2019-12-08 ENCOUNTER — Other Ambulatory Visit: Payer: Self-pay

## 2019-12-08 ENCOUNTER — Ambulatory Visit (HOSPITAL_COMMUNITY)
Admission: EM | Admit: 2019-12-08 | Discharge: 2019-12-08 | Disposition: A | Discharge status: Home / Self Care | Payer: Medicare Other | Attending: Family Medicine | Admitting: Family Medicine

## 2019-12-08 ENCOUNTER — Encounter (HOSPITAL_COMMUNITY): Payer: Self-pay

## 2019-12-08 DIAGNOSIS — N898 Other specified noninflammatory disorders of vagina: Secondary | ICD-10-CM | POA: Diagnosis not present

## 2019-12-08 DIAGNOSIS — D573 Sickle-cell trait: Secondary | ICD-10-CM | POA: Diagnosis not present

## 2019-12-08 DIAGNOSIS — Z20822 Contact with and (suspected) exposure to covid-19: Secondary | ICD-10-CM | POA: Diagnosis not present

## 2019-12-08 LAB — POCT URINALYSIS DIP (DEVICE)
Bilirubin Urine: NEGATIVE
Glucose, UA: NEGATIVE mg/dL
Ketones, ur: NEGATIVE mg/dL
Leukocytes,Ua: NEGATIVE
Nitrite: NEGATIVE
Protein, ur: NEGATIVE mg/dL
Specific Gravity, Urine: 1.025 (ref 1.005–1.030)
Urobilinogen, UA: 0.2 mg/dL (ref 0.0–1.0)
pH: 5.5 (ref 5.0–8.0)

## 2019-12-08 MED ORDER — METRONIDAZOLE 500 MG PO TABS: 500.0000 mg | ORAL_TABLET | Freq: Two times a day (BID) | ORAL | 0 refills | Status: DC

## 2019-12-08 NOTE — ED Triage Notes (Signed)
Pt state she has chills and abdominal discomfort.  X days. Pt state she wants STD testing done. Pt  Thinks she may have some vaginal discharge, she's not sure. X 2 days.

## 2019-12-08 NOTE — Discharge Instructions (Signed)
Treating you for bacterial vaginosis.  Sending a swab for testing and will call with any positive results.  You can take ibuprofen for pain as needed.  Contact given for primary care follow up

## 2019-12-09 NOTE — ED Provider Notes (Signed)
[facility]    CSN: [id number] Arrival date & time: 12/08/19  0850      History   Chief Complaint Chief Complaint  Patient presents with  . Chills  . Abdominal Pain  . SEXUALLY TRANSMITTED DISEASE    HPI Theresa Schroeder is a 34 y.o. female.   34 year old female the presents today for lower abdominal discomfort, vaginal discharge, odor x2 to 3 days.  Symptoms have been constant.  She is requesting testing for STDs.  Currently sexually active, unprotected with multiple partners.  Denies any fever, chills, body aches, dysuria, hematuria or urinary frequency. No LMP recorded. Patient has had an implant.  ROS per HPI      Past Medical History:  Diagnosis Date  . Anemia    sickle cell anemia  . Nystagmus     Patient Active Problem List   Diagnosis Date Noted  . Family planning, subdermal contraceptive insertion 04/21/2012  . Nystagmus, congenital 01/25/2012  . Sickle cell trait ([place]) 01/21/2012    Past Surgical History:  Procedure Laterality Date  . CATARACT EXTRACTION    . CESAREAN SECTION  2009    OB History    Gravida  3   Para  2   Term  1   Preterm  1   AB  1   Living  2     SAB  0   TAB  1   Ectopic  0   Multiple  0   Live Births  2            Home Medications    Prior to Admission medications   Medication Sig Start Date End Date Taking? Authorizing Provider  metroNIDAZOLE (FLAGYL) 500 MG tablet Take 1 tablet (500 mg total) by mouth 2 (two) times daily. 12/08/19   Iva Posten, Tressia Miners A, NP  fluticasone (FLONASE) 50 MCG/ACT nasal spray Place 1-2 sprays into both nostrils daily for 7 days. 10/28/18 06/12/19  Wieters, Elesa Hacker, PA-C    Family History Family History  Problem Relation Age of Onset  . Healthy Mother   . Miscarriages / Stillbirths Sister   . Cataracts Father   . Cancer Maternal Aunt   . Diabetes Maternal Aunt   . Early death Maternal Uncle   . Anesthesia problems Neg Hx     Social History Social History    Tobacco Use  . Smoking status: Never Smoker  . Smokeless tobacco: Never Used  Substance Use Topics  . Alcohol use: No  . Drug use: No     Allergies   Patient has no known allergies.   Review of Systems Review of Systems   Physical Exam Triage Vital Signs ED Triage Vitals  Enc Vitals Group     BP 12/08/19 0903 121/76     Pulse Rate 12/08/19 0903 91     Resp 12/08/19 0903 18     Temp 12/08/19 0903 98.9 F (37.2 C)     Temp Source 12/08/19 0903 Oral     SpO2 12/08/19 0903 100 %     Weight 12/08/19 0902 225 lb (102.1 kg)     Height --      Head Circumference --      Peak Flow --      Pain Score 12/08/19 0902 0     Pain Loc --      Pain Edu? --      Excl. in [place]? --    No data found.  Updated Vital Signs BP 121/76 (  BP Location: Right Arm)   Pulse 91   Temp 98.9 F (37.2 C) (Oral)   Resp 18   Wt 225 lb (102.1 kg)   SpO2 100%   BMI 38.62 kg/m   Visual Acuity Right Eye Distance:   Left Eye Distance:   Bilateral Distance:    Right Eye Near:   Left Eye Near:    Bilateral Near:     Physical Exam Vitals and nursing note reviewed.  Constitutional:      General: She is not in acute distress.    Appearance: Normal appearance. She is not ill-appearing, toxic-appearing or diaphoretic.  HENT:     Head: Normocephalic.     Nose: Nose normal.     Mouth/Throat:     Pharynx: Oropharynx is clear.  Eyes:     Conjunctiva/sclera: Conjunctivae normal.     Comments: Nystagmus   Pulmonary:     Effort: Pulmonary effort is normal.  Abdominal:     General: Abdomen is protuberant. Bowel sounds are normal. There is no distension.     Palpations: Abdomen is soft.     Tenderness: There is no abdominal tenderness.     Comments: Nontender to palpation of entire abdomen.  Genitourinary:    Vagina: Vaginal discharge present.     Comments: External vaginal exam without lesions, erythema or swelling.  Discharge noted in vaginal vault and white, milky discharge from cervix.   Malodorous No cervical motion tenderness or friability. Musculoskeletal:        General: Normal range of motion.     Cervical back: Normal range of motion.  Skin:    General: Skin is warm and dry.     Findings: No rash.  Neurological:     Mental Status: She is alert.  Psychiatric:        Mood and Affect: Mood normal.      UC Treatments / Results  Labs (all labs ordered are listed, but only abnormal results are displayed) Labs Reviewed  POCT URINALYSIS DIP (DEVICE) - Abnormal; Notable for the following components:      Result Value   Hgb urine dipstick SMALL (*)    All other components within normal limits  NOVEL CORONAVIRUS, NAA (HOSP ORDER, SEND-OUT TO REF LAB; TAT 18-24 HRS)  CERVICOVAGINAL ANCILLARY ONLY    EKG   Radiology No results found.  Procedures Procedures (including critical care time)  Medications Ordered in UC Medications - No data to display  Initial Impression / Assessment and Plan / UC Course  I have reviewed the triage vital signs and the nursing notes.  Pertinent labs & imaging results that were available during my care of the patient were reviewed by me and considered in my medical decision making (see chart for details).     Vaginal discharge- treating for BV based on exam and hx Sending swab for testing, concerned for STDs based on exam No concern for PID.  Non tender on exam.  Follow up as needed for continued or worsening symptoms   Final Clinical Impressions(s) / UC Diagnoses   Final diagnoses:  Vaginal discharge     Discharge Instructions     Treating you for bacterial vaginosis.  Sending a swab for testing and will call with any positive results.  You can take ibuprofen for pain as needed.  Contact given for primary care follow up      ED Prescriptions    Medication Sig Dispense Auth. Provider   metroNIDAZOLE (FLAGYL) 500 MG tablet Take 1  tablet (500 mg total) by mouth 2 (two) times daily. 14 tablet Karson Chicas A, NP       PDMP not reviewed this encounter.   Orvan July, NP 12/09/19 [phone]

## 2019-12-10 LAB — CERVICOVAGINAL ANCILLARY ONLY
Bacterial vaginitis: NEGATIVE
Candida vaginitis: NEGATIVE
Chlamydia: NEGATIVE
Neisseria Gonorrhea: NEGATIVE
Trichomonas: NEGATIVE

## 2019-12-10 LAB — NOVEL CORONAVIRUS, NAA (HOSP ORDER, SEND-OUT TO REF LAB; TAT 18-24 HRS): SARS-CoV-2, NAA: NOT DETECTED

## 2020-03-09 DIAGNOSIS — D3102 Benign neoplasm of left conjunctiva: Secondary | ICD-10-CM | POA: Diagnosis not present

## 2020-04-08 ENCOUNTER — Other Ambulatory Visit: Payer: Self-pay

## 2020-04-08 ENCOUNTER — Encounter (HOSPITAL_COMMUNITY): Payer: Self-pay | Admitting: Emergency Medicine

## 2020-04-08 ENCOUNTER — Ambulatory Visit (HOSPITAL_COMMUNITY)
Admission: EM | Admit: 2020-04-08 | Discharge: 2020-04-08 | Disposition: A | Discharge status: Home / Self Care | Payer: Medicare Other | Attending: Physician Assistant | Admitting: Physician Assistant

## 2020-04-08 DIAGNOSIS — Z3202 Encounter for pregnancy test, result negative: Secondary | ICD-10-CM

## 2020-04-08 DIAGNOSIS — R1011 Right upper quadrant pain: Secondary | ICD-10-CM | POA: Diagnosis present

## 2020-04-08 DIAGNOSIS — Z113 Encounter for screening for infections with a predominantly sexual mode of transmission: Secondary | ICD-10-CM | POA: Insufficient documentation

## 2020-04-08 LAB — COMPREHENSIVE METABOLIC PANEL
ALT: 12 U/L (ref 0–44)
AST: 17 U/L (ref 15–41)
Albumin: 3.7 g/dL (ref 3.5–5.0)
Alkaline Phosphatase: 64 U/L (ref 38–126)
Anion gap: 9 (ref 5–15)
BUN: 7 mg/dL (ref 6–20)
CO2: 23 mmol/L (ref 22–32)
Calcium: 8.9 mg/dL (ref 8.9–10.3)
Chloride: 103 mmol/L (ref 98–111)
Creatinine, Ser: 0.83 mg/dL (ref 0.44–1.00)
GFR calc Af Amer: >60 mL/min (ref >60)
GFR calc non Af Amer: >60 mL/min (ref >60)
Glucose, Bld: 145 mg/dL — ABNORMAL HIGH (ref 70–99)
Potassium: 4 mmol/L (ref 3.5–5.1)
Sodium: 135 mmol/L (ref 135–145)
Total Bilirubin: 0.5 mg/dL (ref 0.3–1.2)
Total Protein: 7.6 g/dL (ref 6.5–8.1)

## 2020-04-08 LAB — CBC
HCT: 36.6 % (ref 36.0–46.0)
Hemoglobin: 11.9 g/dL — ABNORMAL LOW (ref 12.0–15.0)
MCH: 27.9 pg (ref 26.0–34.0)
MCHC: 32.5 g/dL (ref 30.0–36.0)
MCV: 85.9 fL (ref 80.0–100.0)
Platelets: 352 10*3/uL (ref 150–400)
RBC: 4.26 MIL/uL (ref 3.87–5.11)
RDW: 12.9 % (ref 11.5–15.5)
WBC: 6.4 10*3/uL (ref 4.0–10.5)
nRBC: 0 % (ref 0.0–0.2)

## 2020-04-08 LAB — POC URINE PREG, ED: Preg Test, Ur: NEGATIVE

## 2020-04-08 LAB — LIPASE, BLOOD: Lipase: 25 U/L (ref 11–51)

## 2020-04-08 MED ORDER — FAMOTIDINE 20 MG PO TABS
20.0000 mg | ORAL_TABLET | Freq: Two times a day (BID) | ORAL | 0 refills | Status: DC
Start: 2020-04-08 — End: 2022-08-13

## 2020-04-08 NOTE — Discharge Instructions (Signed)
We have sent tests, we will notify you of any urgent/emergent findings requiring immediate attention  Call the internal medicine center today to establish care and have follow up on todays visit and potential Ultrasound   If you develop worsening belly pain, fever, vomiting go to the Emergency Room for evaluation  Abstain from sexual intercourse until all of your results return

## 2020-04-08 NOTE — ED Provider Notes (Signed)
[facility]    CSN: [id number] Arrival date & time: 04/08/20  3419      History   Chief Complaint Chief Complaint  Patient presents with  . Abdominal Pain  . Nausea    HPI Theresa Schroeder is a 34 y.o. female.   Patient presents for evaluation of upper abdominal pressure and discomfort as well as on and off nausea.  She reports pressure/pain has been present for about a week.  She reports this is on and off and not constant.  She describes it as a pressure and cramping sensation.  She reports it is hard to predict when the pain will occur.  She rates the pain as a 2-4/ 10.  Food does not seem to change it.  She has had some belching.  She is moving her bowels daily, without diarrhea or constipation.  She denies any fever or chills.  There had been no vomiting.  She has had some decreased appetite and is eating about 1 meal a day and this is not really driven by nausea but more lack of appetite she says.  She denies a history of having issues with acid reflux.  She has taken ibuprofen up to 2-3 times a week for headaches.  She has never taken anything for acid reduction.  Denies any painful urination, frequency or urgency.  She reports she would like STI testing as she is sexually active.  She is unsure whether she has any vaginal discharge.  Denies any vaginal pain or lower abdominal pain.  Denies abdominal surgeries.  She reports she does not have a primary care provider and is looking for 1     Past Medical History:  Diagnosis Date  . Anemia    sickle cell anemia  . Nystagmus     Patient Active Problem List   Diagnosis Date Noted  . Family planning, subdermal contraceptive insertion 04/21/2012  . Nystagmus, congenital 01/25/2012  . Sickle cell trait ([place]) 01/21/2012    Past Surgical History:  Procedure Laterality Date  . CATARACT EXTRACTION    . CESAREAN SECTION  2009    OB History    Gravida  3   Para  2   Term  1   Preterm  1   AB  1   Living   2     SAB  0   TAB  1   Ectopic  0   Multiple  0   Live Births  2            Home Medications    Prior to Admission medications   Medication Sig Start Date End Date Taking? Authorizing Provider  famotidine (PEPCID) 20 MG tablet Take 1 tablet (20 mg total) by mouth 2 (two) times daily. 04/08/20   Rubina Basinski, Marguerita Beards, PA-C  metroNIDAZOLE (FLAGYL) 500 MG tablet Take 1 tablet (500 mg total) by mouth 2 (two) times daily. 12/08/19   Bast, Tressia Miners A, NP  fluticasone (FLONASE) 50 MCG/ACT nasal spray Place 1-2 sprays into both nostrils daily for 7 days. 10/28/18 06/12/19  Wieters, Elesa Hacker, PA-C    Family History Family History  Problem Relation Age of Onset  . Healthy Mother   . Miscarriages / Stillbirths Sister   . Cataracts Father   . Cancer Maternal Aunt   . Diabetes Maternal Aunt   . Early death Maternal Uncle   . Anesthesia problems Neg Hx     Social History Social History   Tobacco Use  .  Smoking status: Never Smoker  . Smokeless tobacco: Never Used  Vaping Use  . Vaping Use: Never used  Substance Use Topics  . Alcohol use: No  . Drug use: No     Allergies   Patient has no known allergies.   Review of Systems Review of Systems   Physical Exam Triage Vital Signs ED Triage Vitals  Enc Vitals Group     BP 04/08/20 1033 123/71     Pulse Rate 04/08/20 1031 79     Resp 04/08/20 1031 16     Temp 04/08/20 1031 98.7 F (37.1 C)     Temp Source 04/08/20 1031 Oral     SpO2 04/08/20 1031 100 %     Weight --      Height --      Head Circumference --      Peak Flow --      Pain Score 04/08/20 1029 2     Pain Loc --      Pain Edu? --      Excl. in [place]? --    No data found.  Updated Vital Signs BP 123/71   Pulse 79   Temp 98.7 F (37.1 C) (Oral)   Resp 16   LMP  (LMP Unknown)   SpO2 100%   Visual Acuity Right Eye Distance:   Left Eye Distance:   Bilateral Distance:    Right Eye Near:   Left Eye Near:    Bilateral Near:     Physical Exam Vitals  and nursing note reviewed.  Constitutional:      General: She is not in acute distress.    Appearance: She is well-developed. She is not ill-appearing or toxic-appearing.  HENT:     Head: Normocephalic and atraumatic.  Eyes:     Conjunctiva/sclera: Conjunctivae normal.  Cardiovascular:     Rate and Rhythm: Normal rate and regular rhythm.     Heart sounds: No murmur heard.   Pulmonary:     Effort: Pulmonary effort is normal. No respiratory distress.     Breath sounds: Normal breath sounds.  Abdominal:     General: Abdomen is flat. Bowel sounds are normal.     Palpations: Abdomen is soft. There is no hepatomegaly or splenomegaly.     Tenderness: There is abdominal tenderness (mild, patient reports tenderness is a 4/10) in the right upper quadrant and epigastric area. There is no right CVA tenderness, left CVA tenderness, guarding or rebound. Negative signs include Murphy's sign.     Hernia: No hernia is present.  Musculoskeletal:     Cervical back: Neck supple.  Skin:    General: Skin is warm and dry.  Neurological:     Mental Status: She is alert.      UC Treatments / Results  Labs (all labs ordered are listed, but only abnormal results are displayed) Labs Reviewed  CBC - Abnormal; Notable for the following components:      Result Value   Hemoglobin 11.9 (*)    All other components within normal limits  COMPREHENSIVE METABOLIC PANEL - Abnormal; Notable for the following components:   Glucose, Bld 145 (*)    All other components within normal limits  LIPASE, BLOOD  POC URINE PREG, ED  CERVICOVAGINAL ANCILLARY ONLY    EKG   Radiology No results found.  Procedures Procedures (including critical care time)  Medications Ordered in UC Medications - No data to display  Initial Impression / Assessment and Plan / UC Course  I have reviewed the triage vital signs and the nursing notes.  Pertinent labs & imaging results that were available during my care of the  patient were reviewed by me and considered in my medical decision making (see chart for details).     #Right upper quadrant pain #Screen for STD Patient is 34 year old presenting with on and off right upper quadrant pain and requesting STD test.  Differential is broad, to include biliary, hepatic, PUD, gastritis, pancreatitis, however she has normal vital signs here in clinic and while she does have tenderness on exam it is mild.  Initial lab work reassuring with normal white blood cell count, CMP and lipase, making less likely biliary, pancreatitis or hepatic at this point.  Patient started on Pepcid twice daily with instructions to follow-up with the internal medicine center to establish care and follow-up on today's visit.  Strict emergency department precautions were discussed.  Vaginal self swab was sent.  Patient verbalizes understanding plan of care and comfort with plan. Final Clinical Impressions(s) / UC Diagnoses   Final diagnoses:  Right upper quadrant abdominal pain  Screen for STD (sexually transmitted disease)     Discharge Instructions     We have sent tests, we will notify you of any urgent/emergent findings requiring immediate attention  Call the internal medicine center today to establish care and have follow up on todays visit and potential Ultrasound   If you develop worsening belly pain, fever, vomiting go to the Emergency Room for evaluation  Abstain from sexual intercourse until all of your results return      ED Prescriptions    Medication Sig Dispense Auth. Provider   famotidine (PEPCID) 20 MG tablet Take 1 tablet (20 mg total) by mouth 2 (two) times daily. 30 tablet Gwenda Heiner, Marguerita Beards, PA-C     PDMP not reviewed this encounter.   Purnell Shoemaker, PA-C 04/08/20 2358

## 2020-04-08 NOTE — ED Triage Notes (Signed)
PT reports RUQ abdominal "pressure" that started earlier this week. Nausea started at same time. Pressure feels like muscle cramp.

## 2020-04-11 LAB — CERVICOVAGINAL ANCILLARY ONLY
Bacterial Vaginitis (gardnerella): NEGATIVE
Candida Glabrata: NEGATIVE
Candida Vaginitis: NEGATIVE
Chlamydia: NEGATIVE
Comment: NEGATIVE
Comment: NEGATIVE
Comment: NEGATIVE
Comment: NEGATIVE
Comment: NEGATIVE
Comment: NORMAL
Neisseria Gonorrhea: NEGATIVE
Trichomonas: NEGATIVE

## 2020-07-18 ENCOUNTER — Encounter: Payer: Self-pay | Admitting: Family Medicine

## 2020-07-18 ENCOUNTER — Other Ambulatory Visit (HOSPITAL_COMMUNITY)
Admission: RE | Admit: 2020-07-18 | Discharge: 2020-07-18 | Disposition: A | Discharge status: Home / Self Care | Payer: Medicare Other | Source: Ambulatory Visit | Attending: Family Medicine | Admitting: Family Medicine

## 2020-07-18 ENCOUNTER — Other Ambulatory Visit: Payer: Self-pay

## 2020-07-18 ENCOUNTER — Ambulatory Visit (INDEPENDENT_AMBULATORY_CARE_PROVIDER_SITE_OTHER): Payer: Medicare Other | Admitting: Family Medicine

## 2020-07-18 VITALS — BP 131/90 | HR 101 | Temp 99.3°F | Wt 229.0 lb

## 2020-07-18 DIAGNOSIS — Z113 Encounter for screening for infections with a predominantly sexual mode of transmission: Secondary | ICD-10-CM | POA: Insufficient documentation

## 2020-07-18 DIAGNOSIS — Z202 Contact with and (suspected) exposure to infections with a predominantly sexual mode of transmission: Secondary | ICD-10-CM

## 2020-07-18 DIAGNOSIS — Z114 Encounter for screening for human immunodeficiency virus [HIV]: Secondary | ICD-10-CM

## 2020-07-18 NOTE — Progress Notes (Signed)
   Subjective:    Patient ID: Theresa Schroeder, female    DOB: Apr 15, 1986, 34 y.o.   MRN: [id number]  HPI Patient here for STI screening. Had exposure. Was initially scheduled for nexplanon removed and replaced, but nexplanon is only 32 year old and wants to wait until December to remove as she is looking at becoming pregnant early next year.   Review of Systems     Objective:   Physical Exam Vitals reviewed.  Constitutional:      Appearance: Normal appearance.  Skin:    Capillary Refill: Capillary refill takes less than 2 seconds.  Neurological:     General: No focal deficit present.     Mental Status: She is alert.  Psychiatric:        Mood and Affect: Mood normal.        Behavior: Behavior normal.        Thought Content: Thought content normal.        Judgment: Judgment normal.        Assessment & Plan:  1. STD exposure - Hepatitis B surface antigen - Hepatitis C antibody - HIV Antibody (routine testing w rflx) - RPR  2. Encounter for screening for human immunodeficiency virus (HIV)  - HIV Antibody (routine testing w rflx)

## 2020-07-18 NOTE — Addendum Note (Signed)
Addended by: Louisa Second E on: 07/18/2020 02:08 PM   Modules accepted: Orders

## 2020-07-19 LAB — CERVICOVAGINAL ANCILLARY ONLY
Chlamydia: NEGATIVE
Comment: NEGATIVE
Comment: NEGATIVE
Comment: NORMAL
Neisseria Gonorrhea: NEGATIVE
Trichomonas: NEGATIVE

## 2020-07-19 LAB — HEPATITIS B SURFACE ANTIGEN: Hepatitis B Surface Ag: NEGATIVE

## 2020-07-19 LAB — HEPATITIS C ANTIBODY: Hep C Virus Ab: <0.1 s/co ratio (ref 0.0–0.9)

## 2020-07-19 LAB — HIV ANTIBODY (ROUTINE TESTING W REFLEX): HIV Screen 4th Generation wRfx: NONREACTIVE

## 2020-07-19 LAB — RPR: RPR Ser Ql: NONREACTIVE

## 2020-10-03 ENCOUNTER — Ambulatory Visit (HOSPITAL_COMMUNITY)
Admission: EM | Admit: 2020-10-03 | Discharge: 2020-10-03 | Disposition: A | Discharge status: Home / Self Care | Payer: Medicare Other | Attending: Emergency Medicine | Admitting: Emergency Medicine

## 2020-10-03 ENCOUNTER — Encounter (HOSPITAL_COMMUNITY): Payer: Self-pay | Admitting: *Deleted

## 2020-10-03 ENCOUNTER — Other Ambulatory Visit: Payer: Self-pay

## 2020-10-03 DIAGNOSIS — R059 Cough, unspecified: Secondary | ICD-10-CM | POA: Insufficient documentation

## 2020-10-03 DIAGNOSIS — Z20822 Contact with and (suspected) exposure to covid-19: Secondary | ICD-10-CM | POA: Diagnosis not present

## 2020-10-03 LAB — SARS CORONAVIRUS 2 (TAT 6-24 HRS): SARS Coronavirus 2: NEGATIVE

## 2020-10-03 NOTE — ED Provider Notes (Signed)
[facility]    CSN: [id number] Arrival date & time: 10/03/20  9024      History   Chief Complaint Chief Complaint  Patient presents with   Cough    HPI Theresa Schroeder is a 34 y.o. female.   Patient presents with 8-day history of nonproductive cough and chills.  She requests a COVID test.  She denies fever, rash, sore throat, shortness of breath, vomiting, diarrhea, or other symptoms.  OTC treatment attempted at home.  Her medical history includes sickle cell trait and anemia.  The history is provided by the patient and medical records.    Past Medical History:  Diagnosis Date   Anemia    sickle cell anemia   Nystagmus     Patient Active Problem List   Diagnosis Date Noted   Family planning, subdermal contraceptive insertion 04/21/2012   Nystagmus, congenital 01/25/2012   Sickle cell trait ([place]) 01/21/2012    Past Surgical History:  Procedure Laterality Date   CATARACT EXTRACTION     CESAREAN SECTION  2009    OB History    Gravida  3   Para  2   Term  1   Preterm  1   AB  1   Living  2     SAB  0   IAB  1   Ectopic  0   Multiple  0   Live Births  2            Home Medications    Prior to Admission medications   Medication Sig Start Date End Date Taking? Authorizing Provider  famotidine (PEPCID) 20 MG tablet Take 1 tablet (20 mg total) by mouth 2 (two) times daily. Patient not taking: Reported on 07/18/2020 04/08/20   Darr, Edison Nasuti, PA-C  metroNIDAZOLE (FLAGYL) 500 MG tablet Take 1 tablet (500 mg total) by mouth 2 (two) times daily. Patient not taking: Reported on 07/18/2020 12/08/19   Loura Halt A, NP  fluticasone (FLONASE) 50 MCG/ACT nasal spray Place 1-2 sprays into both nostrils daily for 7 days. 10/28/18 06/12/19  Wieters, Elesa Hacker, PA-C    Family History Family History  Problem Relation Age of Onset   Healthy Mother    31 / Stillbirths Sister    Cataracts Father    Cancer Maternal Aunt     Diabetes Maternal Aunt    Early death Maternal Uncle    Anesthesia problems Neg Hx     Social History Social History   Tobacco Use   Smoking status: Never Smoker   Smokeless tobacco: Never Used  Scientific laboratory technician Use: Never used  Substance Use Topics   Alcohol use: No   Drug use: No     Allergies   Patient has no known allergies.   Review of Systems Review of Systems  Constitutional: Positive for chills. Negative for fever.  HENT: Negative for congestion, ear pain, postnasal drip, rhinorrhea and sore throat.   Eyes: Negative for pain and visual disturbance.  Respiratory: Positive for cough. Negative for shortness of breath.   Cardiovascular: Negative for chest pain and palpitations.  Gastrointestinal: Negative for abdominal pain, diarrhea and vomiting.  Genitourinary: Negative for dysuria and hematuria.  Musculoskeletal: Negative for arthralgias and back pain.  Skin: Negative for color change and rash.  Neurological: Negative for seizures and syncope.  All other systems reviewed and are negative.    Physical Exam Triage Vital Signs ED Triage Vitals  Enc Vitals Group  BP 10/03/20 0954 139/85     Pulse Rate 10/03/20 0954 85     Resp 10/03/20 0954 18     Temp 10/03/20 0954 97.9 F (36.6 C)     Temp Source 10/03/20 0954 Oral     SpO2 10/03/20 0954 100 %     Weight 10/03/20 0957 230 lb (104.3 kg)     Height 10/03/20 0957 5\' 4"  (1.626 m)     Head Circumference --      Peak Flow --      Pain Score 10/03/20 0957 0     Pain Loc --      Pain Edu? --      Excl. in [place]? --    No data found.  Updated Vital Signs BP 139/85 (BP Location: Right Arm)    Pulse 85    Temp 97.9 F (36.6 C) (Oral)    Resp 18    Ht 5\' 4"  (1.626 m)    Wt 230 lb (104.3 kg)    LMP 09/12/2020    SpO2 100%    BMI 39.48 kg/m   Visual Acuity Right Eye Distance:   Left Eye Distance:   Bilateral Distance:    Right Eye Near:   Left Eye Near:    Bilateral Near:     Physical  Exam Vitals and nursing note reviewed.  Constitutional:      General: She is not in acute distress.    Appearance: She is well-developed and well-nourished. She is not ill-appearing.  HENT:     Head: Normocephalic and atraumatic.     Right Ear: Tympanic membrane normal.     Left Ear: Tympanic membrane normal.     Nose: Nose normal.     Mouth/Throat:     Mouth: Mucous membranes are moist.     Pharynx: Oropharynx is clear.  Eyes:     Conjunctiva/sclera: Conjunctivae normal.  Cardiovascular:     Rate and Rhythm: Normal rate and regular rhythm.     Heart sounds: Normal heart sounds.  Pulmonary:     Effort: Pulmonary effort is normal. No respiratory distress.     Breath sounds: Normal breath sounds.  Abdominal:     Palpations: Abdomen is soft.     Tenderness: There is no abdominal tenderness.  Musculoskeletal:        General: No edema.     Cervical back: Neck supple.  Skin:    General: Skin is warm and dry.     Findings: No rash.  Neurological:     General: No focal deficit present.     Mental Status: She is alert and oriented to person, place, and time.     Gait: Gait normal.  Psychiatric:        Mood and Affect: Mood and affect and mood normal.        Behavior: Behavior normal.      UC Treatments / Results  Labs (all labs ordered are listed, but only abnormal results are displayed) Labs Reviewed  SARS CORONAVIRUS 2 (TAT 6-24 HRS)    EKG   Radiology No results found.  Procedures Procedures (including critical care time)  Medications Ordered in UC Medications - No data to display  Initial Impression / Assessment and Plan / UC Course  I have reviewed the triage vital signs and the nursing notes.  Pertinent labs & imaging results that were available during my care of the patient were reviewed by me and considered in my medical decision making (see chart  for details).   Cough.  COVID pending.  Instructed patient to self quarantine until the test results are  back.  Discussed symptomatic treatment including Tylenol, rest, hydration.  Instructed patient to follow up with PCP if her symptoms are not improving  Patient agrees to plan of care.    Final Clinical Impressions(s) / UC Diagnoses   Final diagnoses:  Cough     Discharge Instructions     Your COVID test is pending.  You should self quarantine until the test result is back.    Take Tylenol or ibuprofen as needed for fever or discomfort.  Rest and keep yourself hydrated.    Follow-up with your primary care provider if your symptoms are not improving.           ED Prescriptions    None     PDMP not reviewed this encounter.   Sharion Balloon, NP 10/03/20 1017

## 2020-10-03 NOTE — Discharge Instructions (Signed)
Your COVID test is pending.  You should self quarantine until the test result is back.    Take Tylenol or ibuprofen as needed for fever or discomfort.  Rest and keep yourself hydrated.    Follow-up with your primary care provider if your symptoms are not improving.     

## 2020-10-03 NOTE — ED Triage Notes (Signed)
PT reports chills, productive cough . Pt denies HA,sore throat or fever.

## 2020-10-04 LAB — CERVICOVAGINAL ANCILLARY ONLY
Bacterial Vaginitis (gardnerella): NEGATIVE
Candida Glabrata: NEGATIVE
Candida Vaginitis: NEGATIVE
Chlamydia: NEGATIVE
Comment: NEGATIVE
Comment: NEGATIVE
Comment: NEGATIVE
Comment: NEGATIVE
Comment: NEGATIVE
Comment: NORMAL
Neisseria Gonorrhea: NEGATIVE
Trichomonas: NEGATIVE

## 2020-10-22 DIAGNOSIS — I10 Essential (primary) hypertension: Secondary | ICD-10-CM

## 2020-10-22 HISTORY — DX: Essential (primary) hypertension: I10

## 2020-11-22 DIAGNOSIS — Z20822 Contact with and (suspected) exposure to covid-19: Secondary | ICD-10-CM | POA: Diagnosis not present

## 2020-11-22 DIAGNOSIS — R110 Nausea: Secondary | ICD-10-CM | POA: Insufficient documentation

## 2020-11-22 DIAGNOSIS — R630 Anorexia: Secondary | ICD-10-CM | POA: Diagnosis not present

## 2020-11-22 DIAGNOSIS — R519 Headache, unspecified: Secondary | ICD-10-CM | POA: Insufficient documentation

## 2020-11-23 ENCOUNTER — Emergency Department (HOSPITAL_COMMUNITY)
Admission: EM | Admit: 2020-11-23 | Discharge: 2020-11-23 | Disposition: A | Discharge status: Home / Self Care | Payer: Medicare Other | Attending: Emergency Medicine | Admitting: Emergency Medicine

## 2020-11-23 ENCOUNTER — Encounter (HOSPITAL_COMMUNITY): Payer: Self-pay

## 2020-11-23 DIAGNOSIS — R519 Headache, unspecified: Secondary | ICD-10-CM

## 2020-11-23 LAB — SARS CORONAVIRUS 2 (TAT 6-24 HRS): SARS Coronavirus 2: NEGATIVE

## 2020-11-23 NOTE — ED Provider Notes (Signed)
[facility] DEPT Provider Note   CSN: [id number] Arrival date & time: 11/22/20  2358     History Chief Complaint  Patient presents with  . Headache    Theresa Schroeder is a 35 y.o. female.  The history is provided by the patient and medical records.  Headache Associated symptoms: nausea     35 year old female with history of anemia, sickle cell trait, presenting to the ED for headache, nausea, and poor appetite that began yesterday.  She denies any known sick contacts or Covid exposures.  She has not had any cough, nasal congestion, fever, or other upper respiratory symptoms.  She denies any chest pain or shortness of breath.  States she was sent home from work and encouraged to get a Covid test.  She has been taking tylenol for headache with some relief.  Past Medical History:  Diagnosis Date  . Anemia     sickle cell trait  . Nystagmus     Patient Active Problem List   Diagnosis Date Noted  . Family planning, subdermal contraceptive insertion 04/21/2012  . Nystagmus, congenital 01/25/2012  . Sickle cell trait ([place]) 01/21/2012    Past Surgical History:  Procedure Laterality Date  . CATARACT EXTRACTION    . CESAREAN SECTION  2009     OB History    Gravida  3   Para  2   Term  1   Preterm  1   AB  1   Living  2     SAB  0   IAB  1   Ectopic  0   Multiple  0   Live Births  2           Family History  Problem Relation Age of Onset  . Healthy Mother   . Miscarriages / Stillbirths Sister   . Cataracts Father   . Cancer Maternal Aunt   . Diabetes Maternal Aunt   . Early death Maternal Uncle   . Anesthesia problems Neg Hx     Social History   Tobacco Use  . Smoking status: Never Smoker  . Smokeless tobacco: Never Used  Vaping Use  . Vaping Use: Never used  Substance Use Topics  . Alcohol use: No  . Drug use: No    Home Medications Prior to Admission medications   Medication Sig Start Date End Date  Taking? Authorizing Provider  famotidine (PEPCID) 20 MG tablet Take 1 tablet (20 mg total) by mouth 2 (two) times daily. Patient not taking: Reported on 07/18/2020 04/08/20   Darr, Edison Nasuti, PA-C  metroNIDAZOLE (FLAGYL) 500 MG tablet Take 1 tablet (500 mg total) by mouth 2 (two) times daily. Patient not taking: Reported on 07/18/2020 12/08/19   Loura Halt A, NP  fluticasone (FLONASE) 50 MCG/ACT nasal spray Place 1-2 sprays into both nostrils daily for 7 days. 10/28/18 06/12/19  Wieters, Hallie C, PA-C    Allergies    Patient has no known allergies.  Review of Systems   Review of Systems  Gastrointestinal: Positive for nausea.  Neurological: Positive for headaches.  All other systems reviewed and are negative.   Physical Exam Updated Vital Signs BP (!) 139/97 (BP Location: Left Arm)   Pulse 79   Temp 98.8 F (37.1 C) (Oral)   Resp 18   Ht 5\' 4"  (1.626 m)   Wt 104.3 kg   LMP 11/15/2020 (Within Days)   SpO2 100%   BMI 39.48 kg/m   Physical Exam Vitals and nursing  note reviewed.  Constitutional:      General: She is not in acute distress.    Appearance: She is well-developed and well-nourished. She is not diaphoretic.  HENT:     Head: Normocephalic and atraumatic.     Right Ear: External ear normal.     Left Ear: External ear normal.     Mouth/Throat:     Mouth: Oropharynx is clear and moist.  Eyes:     Extraocular Movements: EOM normal.     Conjunctiva/sclera: Conjunctivae normal.     Pupils: Pupils are equal, round, and reactive to light.     Comments: Baseline strabismus  Neck:     Comments: No rigidity, no meningismus Cardiovascular:     Rate and Rhythm: Normal rate and regular rhythm.     Heart sounds: Normal heart sounds. No murmur heard.   Pulmonary:     Effort: Pulmonary effort is normal. No respiratory distress.     Breath sounds: Normal breath sounds. No wheezing or rhonchi.  Abdominal:     General: Bowel sounds are normal.     Palpations: Abdomen is soft.      Tenderness: There is no abdominal tenderness. There is no guarding.  Musculoskeletal:        General: No edema. Normal range of motion.     Cervical back: Full passive range of motion without pain, normal range of motion and neck supple. No rigidity.  Skin:    General: Skin is warm and dry.     Findings: No rash.  Neurological:     Mental Status: She is alert and oriented to person, place, and time.     Cranial Nerves: No cranial nerve deficit.     Sensory: No sensory deficit.     Motor: No tremor or seizure activity.     Deep Tendon Reflexes: Strength normal.     Comments: AAOx3, answering questions and following commands appropriately; equal strength UE and LE bilaterally; CN grossly intact; moves all extremities appropriately without ataxia; no focal neuro deficits or facial asymmetry appreciated  Psychiatric:        Mood and Affect: Mood and affect normal.        Behavior: Behavior normal.        Thought Content: Thought content normal.     ED Results / Procedures / Treatments   Labs (all labs ordered are listed, but only abnormal results are displayed) Labs Reviewed  SARS CORONAVIRUS 2 (TAT 6-24 HRS)    EKG None  Radiology No results found.  Procedures Procedures   Medications Ordered in ED Medications - No data to display  ED Course  I have reviewed the triage vital signs and the nursing notes.  Pertinent labs & imaging results that were available during my care of the patient were reviewed by me and considered in my medical decision making (see chart for details).    MDM Rules/Calculators/A&P  35 year old female presenting to the ED with headache, nausea, and poor appetite that began yesterday.  She denies any fever, chills, cough, nasal congestion, chest pain, or shortness of breath.  She denies any known sick contacts or Covid exposures.  She is afebrile nontoxic in appearance here.  Clinically appears well.  Lungs are clear, no signs of respiratory distress.   Neurologic exam non-focal.  States she was sent home from work and encouraged to get Covid test due to her symptoms.  This has been sent, advised she will be notified of results and will update into  my chart.  She is aware of quarantine precautions if positive.  Follow-up with PCP.  Return here for any new or acute changes.  Final Clinical Impression(s) / ED Diagnoses Final diagnoses:  Nonintractable headache, unspecified chronicity pattern, unspecified headache type    Rx / DC Orders ED Discharge Orders    None       Larene Pickett, PA-C 11/23/20 0031    Fatima Blank, MD 11/24/20 [phone]

## 2020-11-23 NOTE — Discharge Instructions (Addendum)
Covid test sent-- you will be notified if positive and will update into mychart. If positive, you will need to quarantine. Return here for new concerns.

## 2020-11-23 NOTE — ED Triage Notes (Signed)
Pt c/o HA starting Monday, states she took APAP around 9pm with some relief. States she wants a covid test

## 2021-01-04 DIAGNOSIS — R030 Elevated blood-pressure reading, without diagnosis of hypertension: Secondary | ICD-10-CM | POA: Diagnosis not present

## 2021-01-04 DIAGNOSIS — R519 Headache, unspecified: Secondary | ICD-10-CM | POA: Diagnosis not present

## 2021-01-04 DIAGNOSIS — Z13220 Encounter for screening for lipoid disorders: Secondary | ICD-10-CM | POA: Diagnosis not present

## 2021-01-04 DIAGNOSIS — Z1329 Encounter for screening for other suspected endocrine disorder: Secondary | ICD-10-CM | POA: Diagnosis not present

## 2021-01-04 DIAGNOSIS — Z113 Encounter for screening for infections with a predominantly sexual mode of transmission: Secondary | ICD-10-CM | POA: Diagnosis not present

## 2021-01-04 DIAGNOSIS — Z131 Encounter for screening for diabetes mellitus: Secondary | ICD-10-CM | POA: Diagnosis not present

## 2021-01-04 DIAGNOSIS — Z114 Encounter for screening for human immunodeficiency virus [HIV]: Secondary | ICD-10-CM | POA: Diagnosis not present

## 2021-01-04 DIAGNOSIS — Z130 Encounter for screening for diseases of the blood and blood-forming organs and certain disorders involving the immune mechanism: Secondary | ICD-10-CM | POA: Diagnosis not present

## 2021-01-04 DIAGNOSIS — Z0189 Encounter for other specified special examinations: Secondary | ICD-10-CM | POA: Diagnosis not present

## 2021-02-06 DIAGNOSIS — R519 Headache, unspecified: Secondary | ICD-10-CM | POA: Diagnosis not present

## 2021-02-06 DIAGNOSIS — E669 Obesity, unspecified: Secondary | ICD-10-CM | POA: Diagnosis not present

## 2021-02-06 DIAGNOSIS — R030 Elevated blood-pressure reading, without diagnosis of hypertension: Secondary | ICD-10-CM | POA: Diagnosis not present

## 2021-02-06 DIAGNOSIS — R87810 Cervical high risk human papillomavirus (HPV) DNA test positive: Secondary | ICD-10-CM | POA: Diagnosis not present

## 2021-02-06 DIAGNOSIS — Z3046 Encounter for surveillance of implantable subdermal contraceptive: Secondary | ICD-10-CM | POA: Diagnosis not present

## 2021-02-06 DIAGNOSIS — Z01419 Encounter for gynecological examination (general) (routine) without abnormal findings: Secondary | ICD-10-CM | POA: Diagnosis not present

## 2021-02-06 DIAGNOSIS — R87612 Low grade squamous intraepithelial lesion on cytologic smear of cervix (LGSIL): Secondary | ICD-10-CM | POA: Diagnosis not present

## 2021-02-27 DIAGNOSIS — N870 Mild cervical dysplasia: Secondary | ICD-10-CM | POA: Diagnosis not present

## 2021-02-27 DIAGNOSIS — R87612 Low grade squamous intraepithelial lesion on cytologic smear of cervix (LGSIL): Secondary | ICD-10-CM | POA: Diagnosis not present

## 2021-02-27 DIAGNOSIS — N72 Inflammatory disease of cervix uteri: Secondary | ICD-10-CM | POA: Diagnosis not present

## 2021-04-05 DIAGNOSIS — H33322 Round hole, left eye: Secondary | ICD-10-CM | POA: Diagnosis not present

## 2021-05-08 DIAGNOSIS — R030 Elevated blood-pressure reading, without diagnosis of hypertension: Secondary | ICD-10-CM | POA: Diagnosis not present

## 2021-05-08 DIAGNOSIS — R519 Headache, unspecified: Secondary | ICD-10-CM | POA: Diagnosis not present

## 2021-05-08 DIAGNOSIS — E669 Obesity, unspecified: Secondary | ICD-10-CM | POA: Diagnosis not present

## 2021-07-13 ENCOUNTER — Other Ambulatory Visit: Payer: Self-pay

## 2021-07-13 ENCOUNTER — Ambulatory Visit (HOSPITAL_COMMUNITY)
Admission: EM | Admit: 2021-07-13 | Discharge: 2021-07-13 | Disposition: A | Discharge status: Home / Self Care | Payer: Medicare Other | Attending: Emergency Medicine | Admitting: Emergency Medicine

## 2021-07-13 ENCOUNTER — Encounter (HOSPITAL_COMMUNITY): Payer: Self-pay

## 2021-07-13 DIAGNOSIS — Z79899 Other long term (current) drug therapy: Secondary | ICD-10-CM | POA: Insufficient documentation

## 2021-07-13 DIAGNOSIS — R110 Nausea: Secondary | ICD-10-CM | POA: Insufficient documentation

## 2021-07-13 DIAGNOSIS — Z113 Encounter for screening for infections with a predominantly sexual mode of transmission: Secondary | ICD-10-CM | POA: Insufficient documentation

## 2021-07-13 LAB — POCT URINALYSIS DIPSTICK, ED / UC
Bilirubin Urine: NEGATIVE
Glucose, UA: NEGATIVE mg/dL
Ketones, ur: NEGATIVE mg/dL
Nitrite: NEGATIVE
Protein, ur: NEGATIVE mg/dL
Specific Gravity, Urine: 1.015 (ref 1.005–1.030)
Urobilinogen, UA: 2 mg/dL — ABNORMAL HIGH (ref 0.0–1.0)
pH: 6 (ref 5.0–8.0)

## 2021-07-13 LAB — POC URINE PREG, ED: Preg Test, Ur: NEGATIVE

## 2021-07-13 NOTE — ED Provider Notes (Signed)
[facility]    CSN: [id number] Arrival date & time: 07/13/21  0849      History   Chief Complaint Chief Complaint  Patient presents with   Nausea    HPI Theresa Schroeder is a 35 y.o. female.   Patient here for evaluation of nausea that has been ongoing for the past several days.  Reports nausea is worse in the morning.  Reports having unprotected sex.  Patient is visually impaired and so she is unsure about any discharge.  Denies any abdominal or vaginal pain or irritation.  Denies any dysuria, urgency, or frequency.  Denies any trauma, injury, or other precipitating event.  Denies any specific alleviating or aggravating factors.  Denies any fevers, chest pain, shortness of breath, numbness, tingling, weakness, abdominal pain, or headaches.    The history is provided by the patient.   Past Medical History:  Diagnosis Date   Anemia    sickle cell anemia   Nystagmus     Patient Active Problem List   Diagnosis Date Noted   Family planning, subdermal contraceptive insertion 04/21/2012   Nystagmus, congenital 01/25/2012   Sickle cell trait ([place]) 01/21/2012    Past Surgical History:  Procedure Laterality Date   CATARACT EXTRACTION     CESAREAN SECTION  2009    OB History     Gravida  3   Para  2   Term  1   Preterm  1   AB  1   Living  2      SAB  0   IAB  1   Ectopic  0   Multiple  0   Live Births  2            Home Medications    Prior to Admission medications   Medication Sig Start Date End Date Taking? Authorizing Provider  famotidine (PEPCID) 20 MG tablet Take 1 tablet (20 mg total) by mouth 2 (two) times daily. Patient not taking: No sig reported 04/08/20   Darr, Edison Nasuti, PA-C  metroNIDAZOLE (FLAGYL) 500 MG tablet Take 1 tablet (500 mg total) by mouth 2 (two) times daily. Patient not taking: No sig reported 12/08/19   Loura Halt A, NP  fluticasone (FLONASE) 50 MCG/ACT nasal spray Place 1-2 sprays into both nostrils daily for 7  days. 10/28/18 06/12/19  Wieters, Elesa Hacker, PA-C    Family History Family History  Problem Relation Age of Onset   Healthy Mother    19 / Stillbirths Sister    Cataracts Father    Cancer Maternal Aunt    Diabetes Maternal Aunt    Early death Maternal Uncle    Anesthesia problems Neg Hx     Social History Social History   Tobacco Use   Smoking status: Never   Smokeless tobacco: Never  Vaping Use   Vaping Use: Never used  Substance Use Topics   Alcohol use: No   Drug use: No     Allergies   Patient has no known allergies.   Review of Systems Review of Systems  Gastrointestinal:  Positive for nausea.  Genitourinary:  Negative for dysuria, frequency, pelvic pain, urgency and vaginal discharge.  All other systems reviewed and are negative.   Physical Exam Triage Vital Signs ED Triage Vitals  Enc Vitals Group     BP 07/13/21 0949 138/84     Pulse Rate 07/13/21 0949 72     Resp 07/13/21 0949 15     Temp 07/13/21 0949 99.6  F (37.6 C)     Temp Source 07/13/21 0949 Oral     SpO2 07/13/21 0949 99 %     Weight --      Height --      Head Circumference --      Peak Flow --      Pain Score 07/13/21 0956 0     Pain Loc --      Pain Edu? --      Excl. in [place]? --    No data found.  Updated Vital Signs BP 138/84 (BP Location: Right Arm)   Pulse 72   Temp 99.6 F (37.6 C) (Oral)   Resp 15   SpO2 99%   Visual Acuity Right Eye Distance:   Left Eye Distance:   Bilateral Distance:    Right Eye Near:   Left Eye Near:    Bilateral Near:     Physical Exam Vitals and nursing note reviewed.  Constitutional:      General: She is not in acute distress.    Appearance: Normal appearance. She is not ill-appearing, toxic-appearing or diaphoretic.  HENT:     Head: Normocephalic and atraumatic.  Eyes:     Conjunctiva/sclera: Conjunctivae normal.  Cardiovascular:     Rate and Rhythm: Normal rate.     Pulses: Normal pulses.  Pulmonary:     Effort:  Pulmonary effort is normal.  Abdominal:     General: Abdomen is flat.     Tenderness: There is no right CVA tenderness or left CVA tenderness.  Genitourinary:    Comments: declines Musculoskeletal:        General: Normal range of motion.     Cervical back: Normal range of motion.  Skin:    General: Skin is warm and dry.  Neurological:     General: No focal deficit present.     Mental Status: She is alert and oriented to person, place, and time.  Psychiatric:        Mood and Affect: Mood normal.     UC Treatments / Results  Labs (all labs ordered are listed, but only abnormal results are displayed) Labs Reviewed  POCT URINALYSIS DIPSTICK, ED / UC - Abnormal; Notable for the following components:      Result Value   Hgb urine dipstick MODERATE (*)    Urobilinogen, UA 2.0 (*)    Leukocytes,Ua MODERATE (*)    All other components within normal limits  URINE CULTURE  POC URINE PREG, ED  CERVICOVAGINAL ANCILLARY ONLY    EKG   Radiology No results found.  Procedures Procedures (including critical care time)  Medications Ordered in UC Medications - No data to display  Initial Impression / Assessment and Plan / UC Course  I have reviewed the triage vital signs and the nursing notes.  Pertinent labs & imaging results that were available during my care of the patient were reviewed by me and considered in my medical decision making (see chart for details).    Assessment negative for red flags or concerns.  Urinalysis with hemoglobin, urobilinogen, and leukocytes.  Pregnancy test negative, urine culture pending.  Self swab obtained and will treat based on results.  Nausea.  Recommended BRAT or bland food diet and then advance as tolerated.  May use ginger or mint to help with nausea.  Encourage fluids and rest.  Follow-up as needed Final Clinical Impressions(s) / UC Diagnoses   Final diagnoses:  Nausea  Screen for STD (sexually transmitted disease)  Discharge  Instructions      Try a BRAT (bananas, rice, applesause, toast) or bland food diet for the next few days.  Stick with foods that are gentle on your stomach.  Avoid foods that are difficult to digest, such as diary.  As you feel better, you can start eating like you do normally.    You can use ginger (ginger ale, ginger candy) and mint for nausea.   Make sure you are drinking plenty of fluids, such as water, powerade/gatorade, pedialyte, juices, and teas.    We will contact you if the results from your lab work are positive and require additional treatment.    Return or go to the Emergency Department if symptoms worsen or do not improve in the next few days.       ED Prescriptions   None    PDMP not reviewed this encounter.   Pearson Forster, NP 07/13/21 1045

## 2021-07-13 NOTE — ED Triage Notes (Addendum)
Pt reports nausea x 3 days. Nausea ir worse in the morning.   Pt requested STD's test.

## 2021-07-13 NOTE — Discharge Instructions (Signed)
Try a BRAT (bananas, rice, applesause, toast) or bland food diet for the next few days.  Stick with foods that are gentle on your stomach.  Avoid foods that are difficult to digest, such as diary.  As you feel better, you can start eating like you do normally.    You can use ginger (ginger ale, ginger candy) and mint for nausea.   Make sure you are drinking plenty of fluids, such as water, powerade/gatorade, pedialyte, juices, and teas.    We will contact you if the results from your lab work are positive and require additional treatment.    Return or go to the Emergency Department if symptoms worsen or do not improve in the next few days.

## 2021-07-14 ENCOUNTER — Telehealth (HOSPITAL_COMMUNITY): Payer: Self-pay | Admitting: Emergency Medicine

## 2021-07-14 LAB — CERVICOVAGINAL ANCILLARY ONLY
Bacterial Vaginitis (gardnerella): POSITIVE — AB
Candida Glabrata: NEGATIVE
Candida Vaginitis: NEGATIVE
Chlamydia: NEGATIVE
Comment: NEGATIVE
Comment: NEGATIVE
Comment: NEGATIVE
Comment: NEGATIVE
Comment: NEGATIVE
Comment: NORMAL
Neisseria Gonorrhea: NEGATIVE
Trichomonas: NEGATIVE

## 2021-07-14 LAB — URINE CULTURE

## 2021-07-14 MED ORDER — METRONIDAZOLE 500 MG PO TABS: 500.0000 mg | ORAL_TABLET | Freq: Two times a day (BID) | ORAL | 0 refills | Status: DC

## 2021-07-24 ENCOUNTER — Encounter (HOSPITAL_COMMUNITY): Payer: Self-pay | Admitting: Emergency Medicine

## 2021-07-24 ENCOUNTER — Other Ambulatory Visit: Payer: Self-pay

## 2021-07-24 ENCOUNTER — Ambulatory Visit (HOSPITAL_COMMUNITY)
Admission: EM | Admit: 2021-07-24 | Discharge: 2021-07-24 | Disposition: A | Discharge status: Home / Self Care | Payer: Medicare Other | Attending: Emergency Medicine | Admitting: Emergency Medicine

## 2021-07-24 DIAGNOSIS — Z8742 Personal history of other diseases of the female genital tract: Secondary | ICD-10-CM | POA: Diagnosis not present

## 2021-07-24 DIAGNOSIS — Z79899 Other long term (current) drug therapy: Secondary | ICD-10-CM | POA: Insufficient documentation

## 2021-07-24 DIAGNOSIS — R110 Nausea: Secondary | ICD-10-CM | POA: Insufficient documentation

## 2021-07-24 DIAGNOSIS — Z113 Encounter for screening for infections with a predominantly sexual mode of transmission: Secondary | ICD-10-CM | POA: Diagnosis not present

## 2021-07-24 LAB — CBG MONITORING, ED: Glucose-Capillary: 136 mg/dL — ABNORMAL HIGH (ref 70–99)

## 2021-07-24 LAB — POCT URINALYSIS DIPSTICK, ED / UC
Bilirubin Urine: NEGATIVE
Glucose, UA: 100 mg/dL — AB
Ketones, ur: NEGATIVE mg/dL
Leukocytes,Ua: NEGATIVE
Nitrite: NEGATIVE
Protein, ur: NEGATIVE mg/dL
Specific Gravity, Urine: 1.01 (ref 1.005–1.030)
Urobilinogen, UA: 0.2 mg/dL (ref 0.0–1.0)
pH: 6 (ref 5.0–8.0)

## 2021-07-24 MED ORDER — ONDANSETRON HCL 4 MG PO TABS
4.0000 mg | ORAL_TABLET | Freq: Four times a day (QID) | ORAL | 0 refills | Status: DC
Start: 2021-07-24 — End: 2022-08-13

## 2021-07-24 MED ORDER — CLINDAMYCIN HCL 150 MG PO CAPS
150.0000 mg | ORAL_CAPSULE | Freq: Four times a day (QID) | ORAL | 0 refills | Status: DC
Start: 2021-07-24 — End: 2021-08-30

## 2021-07-24 NOTE — ED Provider Notes (Signed)
[place]    CSN: [id number] Arrival date & time: 07/24/21  1696      History   Chief Complaint Chief Complaint  Patient presents with   Nausea    HPI Theresa Schroeder is a 35 y.o. female.   Pt was seen on 9/22 dx with BV. Pt is still having similar sx and with nausea. No emesis. Denies any vaginal discharge, no abd pain, no fevers. No flank pain. Would like to have another test for STI. Did take the full rx given prior.    Past Medical History:  Diagnosis Date   Anemia    sickle cell anemia   Nystagmus     Patient Active Problem List   Diagnosis Date Noted   Family planning, subdermal contraceptive insertion 04/21/2012   Nystagmus, congenital 01/25/2012   Sickle cell trait ([place]) 01/21/2012    Past Surgical History:  Procedure Laterality Date   CATARACT EXTRACTION     CESAREAN SECTION  2009    OB History     Gravida  3   Para  2   Term  1   Preterm  1   AB  1   Living  2      SAB  0   IAB  1   Ectopic  0   Multiple  0   Live Births  2            Home Medications    Prior to Admission medications   Medication Sig Start Date End Date Taking? Authorizing Provider  clindamycin (CLEOCIN) 150 MG capsule Take 1 capsule (150 mg total) by mouth every 6 (six) hours. 07/24/21  Yes Marney Setting, NP  ondansetron (ZOFRAN) 4 MG tablet Take 1 tablet (4 mg total) by mouth every 6 (six) hours. 07/24/21  Yes Marney Setting, NP  famotidine (PEPCID) 20 MG tablet Take 1 tablet (20 mg total) by mouth 2 (two) times daily. Patient not taking: No sig reported 04/08/20   Darr, Edison Nasuti, PA-C  fluticasone [facility]) 50 MCG/ACT nasal spray Place 1-2 sprays into both nostrils daily for 7 days. 10/28/18 06/12/19  Wieters, Elesa Hacker, PA-C    Family History Family History  Problem Relation Age of Onset   Healthy Mother    30 / Stillbirths Sister    Cataracts Father    Cancer Maternal Aunt    Diabetes Maternal Aunt    Early death  Maternal Uncle    Anesthesia problems Neg Hx     Social History Social History   Tobacco Use   Smoking status: Never   Smokeless tobacco: Never  Vaping Use   Vaping Use: Never used  Substance Use Topics   Alcohol use: No   Drug use: No     Allergies   Patient has no known allergies.   Review of Systems Review of Systems  Constitutional:  Negative for chills and fever.  HENT: Negative.    Eyes:        Hx of visual impaired   Respiratory: Negative.    Cardiovascular: Negative.   Gastrointestinal:  Positive for nausea.  Genitourinary: Negative.  Negative for flank pain, frequency, hematuria, pelvic pain, urgency, vaginal bleeding and vaginal discharge.  Musculoskeletal: Negative.   Neurological: Negative.     Physical Exam Triage Vital Signs ED Triage Vitals  Enc Vitals Group     BP 07/24/21 0825 126/87     Pulse Rate 07/24/21 0825 96     Resp --  Temp 07/24/21 0825 98 F (36.7 C)     Temp Source 07/24/21 0825 Oral     SpO2 07/24/21 0825 99 %     Weight --      Height --      Head Circumference --      Peak Flow --      Pain Score 07/24/21 0824 0     Pain Loc --      Pain Edu? --      Excl. in [place]? --    No data found.  Updated Vital Signs BP 126/87 (BP Location: Left Arm)   Pulse 96   Temp 98 F (36.7 C) (Oral)   SpO2 99%   Visual Acuity Right Eye Distance:   Left Eye Distance:   Bilateral Distance:    Right Eye Near:   Left Eye Near:    Bilateral Near:     Physical Exam Constitutional:      Appearance: She is obese.  Cardiovascular:     Rate and Rhythm: Normal rate.  Pulmonary:     Effort: Pulmonary effort is normal.  Abdominal:     General: Abdomen is flat. Bowel sounds are normal.  Skin:    General: Skin is warm.     Capillary Refill: Capillary refill takes less than 2 seconds.  Neurological:     General: No focal deficit present.     Mental Status: She is alert.     UC Treatments / Results  Labs (all labs ordered are  listed, but only abnormal results are displayed) Labs Reviewed  POCT URINALYSIS DIPSTICK, ED / UC - Abnormal; Notable for the following components:      Result Value   Glucose, UA 100 (*)    Hgb urine dipstick TRACE (*)    All other components within normal limits  CBG MONITORING, ED - Abnormal; Notable for the following components:   Glucose-Capillary 136 (*)    All other components within normal limits  URINE CULTURE  URINE CYTOLOGY ANCILLARY ONLY  CERVICOVAGINAL ANCILLARY ONLY    EKG   Radiology No results found.  Procedures Procedures (including critical care time)  Medications Ordered in UC Medications - No data to display  Initial Impression / Assessment and Plan / UC Course  I have reviewed the triage vital signs and the nursing notes.  Pertinent labs & imaging results that were available during my care of the patient were reviewed by me and considered in my medical decision making (see chart for details).    We will send off recheck of the urine and STI panel. Will change the medication treatment for BV. Results will return in 3 days. If positive they will call you if any further treatment is needed. Checked cbg expressed cbg slighted elevated since  she was fasting. Pt should follow up with her pcp about this.    Final Clinical Impressions(s) / UC Diagnoses   Final diagnoses:  Nausea     Discharge Instructions      We will send off recheck of the urine and STI panel. Will change the medication treatment for BV. Results will return in 3 days. If positive they will call you if any further treatment is needed.       ED Prescriptions     Medication Sig Dispense Auth. Provider   ondansetron (ZOFRAN) 4 MG tablet Take 1 tablet (4 mg total) by mouth every 6 (six) hours. 12 tablet Morley Kos L, NP   clindamycin (CLEOCIN) 150 MG  capsule Take 1 capsule (150 mg total) by mouth every 6 (six) hours. 28 capsule Marney Setting, NP      PDMP not  reviewed this encounter.   Marney Setting, NP 07/24/21 1023

## 2021-07-24 NOTE — Discharge Instructions (Addendum)
We will send off recheck of the urine and STI panel. Will change the medication treatment for BV. Results will return in 3 days. If positive they will call you if any further treatment is needed.

## 2021-07-24 NOTE — ED Triage Notes (Signed)
Pt seen 9/22 for nausea. She continues to have nausea and would like STD testing.

## 2021-07-25 LAB — CERVICOVAGINAL ANCILLARY ONLY
Bacterial Vaginitis (gardnerella): NEGATIVE
Candida Glabrata: NEGATIVE
Candida Vaginitis: POSITIVE — AB
Chlamydia: NEGATIVE
Comment: NEGATIVE
Comment: NEGATIVE
Comment: NEGATIVE
Comment: NEGATIVE
Comment: NEGATIVE
Comment: NORMAL
Neisseria Gonorrhea: NEGATIVE
Trichomonas: NEGATIVE

## 2021-07-25 LAB — URINE CYTOLOGY ANCILLARY ONLY
Bacterial Vaginitis-Urine: NEGATIVE
Candida Urine: NEGATIVE — AB
Candida Urine: POSITIVE — AB
Chlamydia: NEGATIVE
Comment: NEGATIVE
Comment: NEGATIVE
Comment: NORMAL
Neisseria Gonorrhea: NEGATIVE
Trichomonas: NEGATIVE

## 2021-07-25 LAB — URINE CULTURE

## 2021-07-26 ENCOUNTER — Telehealth (HOSPITAL_COMMUNITY): Payer: Self-pay | Admitting: Emergency Medicine

## 2021-07-26 MED ORDER — FLUCONAZOLE 150 MG PO TABS
150.0000 mg | ORAL_TABLET | Freq: Once | ORAL | 0 refills | Status: AC
Start: 2021-07-26 — End: 2021-07-26

## 2021-08-07 DIAGNOSIS — Z13220 Encounter for screening for lipoid disorders: Secondary | ICD-10-CM | POA: Diagnosis not present

## 2021-08-07 DIAGNOSIS — R519 Headache, unspecified: Secondary | ICD-10-CM | POA: Diagnosis not present

## 2021-08-07 DIAGNOSIS — Z09 Encounter for follow-up examination after completed treatment for conditions other than malignant neoplasm: Secondary | ICD-10-CM | POA: Diagnosis not present

## 2021-08-07 DIAGNOSIS — R030 Elevated blood-pressure reading, without diagnosis of hypertension: Secondary | ICD-10-CM | POA: Diagnosis not present

## 2021-08-07 DIAGNOSIS — Z131 Encounter for screening for diabetes mellitus: Secondary | ICD-10-CM | POA: Diagnosis not present

## 2021-08-07 DIAGNOSIS — Z114 Encounter for screening for human immunodeficiency virus [HIV]: Secondary | ICD-10-CM | POA: Diagnosis not present

## 2021-08-07 DIAGNOSIS — R110 Nausea: Secondary | ICD-10-CM | POA: Diagnosis not present

## 2021-08-07 DIAGNOSIS — E669 Obesity, unspecified: Secondary | ICD-10-CM | POA: Diagnosis not present

## 2021-08-07 DIAGNOSIS — Z130 Encounter for screening for diseases of the blood and blood-forming organs and certain disorders involving the immune mechanism: Secondary | ICD-10-CM | POA: Diagnosis not present

## 2021-08-07 DIAGNOSIS — Z113 Encounter for screening for infections with a predominantly sexual mode of transmission: Secondary | ICD-10-CM | POA: Diagnosis not present

## 2021-08-30 ENCOUNTER — Ambulatory Visit (INDEPENDENT_AMBULATORY_CARE_PROVIDER_SITE_OTHER): Payer: Medicare Other | Admitting: Obstetrics & Gynecology

## 2021-08-30 ENCOUNTER — Other Ambulatory Visit: Payer: Self-pay

## 2021-08-30 ENCOUNTER — Encounter: Payer: Self-pay | Admitting: Obstetrics & Gynecology

## 2021-08-30 VITALS — BP 121/86 | HR 89 | Ht 64.0 in | Wt 227.0 lb

## 2021-08-30 DIAGNOSIS — Z23 Encounter for immunization: Secondary | ICD-10-CM

## 2021-08-30 DIAGNOSIS — Z3046 Encounter for surveillance of implantable subdermal contraceptive: Secondary | ICD-10-CM

## 2021-08-30 MED ORDER — ETONOGESTREL 68 MG ~~LOC~~ IMPL
68.0000 mg | DRUG_IMPLANT | Freq: Once | SUBCUTANEOUS | Status: AC
  Administered 2021-08-30: 68 mg via SUBCUTANEOUS

## 2021-08-30 NOTE — Progress Notes (Signed)
     GYNECOLOGY OFFICE PROCEDURE NOTE  Theresa Schroeder is a 35 y.o. [phone] here for Nexplanon removal and reinsertion.  Nexplanon was placed in 06/2018. Last pap smear was on 04/2021 at [facility] and was normal.  No other gynecologic concerns. Desires flu vaccination   Nexplanon Removal and Reinsertion Patient identified, informed consent performed, consent signed.   Patient does understand that irregular bleeding is a very common side effect of this medication. She was advised to have backup contraception for one week after replacement of the implant. Pregnancy test in clinic today was negative.  Appropriate time out taken. Nexplanon site identified in left arm.  Area prepped in usual sterile fashon. One ml of 1% lidocaine was used to anesthetize the area at the distal end of the implant. A small stab incision was made right beside the implant on the distal portion. The Nexplanon rod was grasped using hemostats and removed without difficulty. There was minimal blood loss. There were no complications. Area was then injected with 3 ml of 1 % lidocaine. She was re-prepped with betadine, Nexplanon removed from packaging, Device confirmed in needle, then inserted full length of needle and withdrawn per handbook instructions. Nexplanon was able to palpated in the patient's arm; patient palpated the insert herself.  There was minimal blood loss. Patient insertion site covered with gauze and a pressure bandage to reduce any bruising. The patient tolerated the procedure well and was given post procedure instructions.   Patient aware Nexplanon can be in place for 3 years, can be removed earlier if desired.   Flu vaccination administered as per her desire.   Verita Schneiders, MD, [place] for [employer], [place]

## 2021-08-30 NOTE — Patient Instructions (Signed)
Nexplanon Instructions After Insertion  Keep bandage clean and dry for 24 hours  May use ice/Tylenol/Ibuprofen for soreness or pain  If you develop fever, drainage or increased warmth from incision site-contact office immediately   

## 2021-10-02 DIAGNOSIS — H44511 Absolute glaucoma, right eye: Secondary | ICD-10-CM | POA: Diagnosis not present

## 2021-10-18 DIAGNOSIS — H33051 Total retinal detachment, right eye: Secondary | ICD-10-CM | POA: Diagnosis not present

## 2021-10-18 DIAGNOSIS — H44511 Absolute glaucoma, right eye: Secondary | ICD-10-CM | POA: Diagnosis not present

## 2021-10-18 DIAGNOSIS — H33322 Round hole, left eye: Secondary | ICD-10-CM | POA: Diagnosis not present

## 2021-10-18 DIAGNOSIS — H5500 Unspecified nystagmus: Secondary | ICD-10-CM | POA: Diagnosis not present

## 2021-11-10 DIAGNOSIS — E669 Obesity, unspecified: Secondary | ICD-10-CM | POA: Diagnosis not present

## 2021-11-10 DIAGNOSIS — R519 Headache, unspecified: Secondary | ICD-10-CM | POA: Diagnosis not present

## 2021-11-10 DIAGNOSIS — Z7182 Exercise counseling: Secondary | ICD-10-CM | POA: Diagnosis not present

## 2021-11-10 DIAGNOSIS — I10 Essential (primary) hypertension: Secondary | ICD-10-CM | POA: Diagnosis not present

## 2021-11-10 DIAGNOSIS — Z713 Dietary counseling and surveillance: Secondary | ICD-10-CM | POA: Diagnosis not present

## 2021-11-24 DIAGNOSIS — H44511 Absolute glaucoma, right eye: Secondary | ICD-10-CM | POA: Diagnosis not present

## 2021-11-24 DIAGNOSIS — H5500 Unspecified nystagmus: Secondary | ICD-10-CM | POA: Diagnosis not present

## 2021-11-24 DIAGNOSIS — H33322 Round hole, left eye: Secondary | ICD-10-CM | POA: Diagnosis not present

## 2021-11-24 DIAGNOSIS — H33051 Total retinal detachment, right eye: Secondary | ICD-10-CM | POA: Diagnosis not present

## 2022-02-12 DIAGNOSIS — R87612 Low grade squamous intraepithelial lesion on cytologic smear of cervix (LGSIL): Secondary | ICD-10-CM | POA: Diagnosis not present

## 2022-02-12 DIAGNOSIS — A5901 Trichomonal vulvovaginitis: Secondary | ICD-10-CM | POA: Diagnosis not present

## 2022-02-12 DIAGNOSIS — E669 Obesity, unspecified: Secondary | ICD-10-CM | POA: Diagnosis not present

## 2022-02-12 DIAGNOSIS — Z713 Dietary counseling and surveillance: Secondary | ICD-10-CM | POA: Diagnosis not present

## 2022-02-12 DIAGNOSIS — L739 Follicular disorder, unspecified: Secondary | ICD-10-CM | POA: Diagnosis not present

## 2022-02-12 DIAGNOSIS — Z7182 Exercise counseling: Secondary | ICD-10-CM | POA: Diagnosis not present

## 2022-02-12 DIAGNOSIS — Z01419 Encounter for gynecological examination (general) (routine) without abnormal findings: Secondary | ICD-10-CM | POA: Diagnosis not present

## 2022-02-12 DIAGNOSIS — R87610 Atypical squamous cells of undetermined significance on cytologic smear of cervix (ASC-US): Secondary | ICD-10-CM | POA: Diagnosis not present

## 2022-02-12 DIAGNOSIS — R87810 Cervical high risk human papillomavirus (HPV) DNA test positive: Secondary | ICD-10-CM | POA: Diagnosis not present

## 2022-02-12 DIAGNOSIS — Z113 Encounter for screening for infections with a predominantly sexual mode of transmission: Secondary | ICD-10-CM | POA: Diagnosis not present

## 2022-02-12 DIAGNOSIS — Z0001 Encounter for general adult medical examination with abnormal findings: Secondary | ICD-10-CM | POA: Diagnosis not present

## 2022-02-12 DIAGNOSIS — I10 Essential (primary) hypertension: Secondary | ICD-10-CM | POA: Diagnosis not present

## 2022-02-23 DIAGNOSIS — H33322 Round hole, left eye: Secondary | ICD-10-CM | POA: Diagnosis not present

## 2022-02-23 DIAGNOSIS — H5500 Unspecified nystagmus: Secondary | ICD-10-CM | POA: Diagnosis not present

## 2022-02-23 DIAGNOSIS — H44511 Absolute glaucoma, right eye: Secondary | ICD-10-CM | POA: Diagnosis not present

## 2022-02-23 DIAGNOSIS — H33051 Total retinal detachment, right eye: Secondary | ICD-10-CM | POA: Diagnosis not present

## 2022-08-13 ENCOUNTER — Ambulatory Visit (INDEPENDENT_AMBULATORY_CARE_PROVIDER_SITE_OTHER): Payer: Medicare Other | Admitting: Internal Medicine

## 2022-08-13 ENCOUNTER — Encounter: Payer: Self-pay | Admitting: Internal Medicine

## 2022-08-13 VITALS — BP 130/82 | HR 96 | Resp 12 | Ht 64.25 in | Wt 230.0 lb

## 2022-08-13 DIAGNOSIS — D649 Anemia, unspecified: Secondary | ICD-10-CM | POA: Insufficient documentation

## 2022-08-13 DIAGNOSIS — Z23 Encounter for immunization: Secondary | ICD-10-CM | POA: Diagnosis not present

## 2022-08-13 DIAGNOSIS — Z6839 Body mass index (BMI) 39.0-39.9, adult: Secondary | ICD-10-CM

## 2022-08-13 DIAGNOSIS — D573 Sickle-cell trait: Secondary | ICD-10-CM | POA: Diagnosis not present

## 2022-08-13 DIAGNOSIS — I10 Essential (primary) hypertension: Secondary | ICD-10-CM | POA: Diagnosis not present

## 2022-08-13 DIAGNOSIS — E66812 Obesity, class 2: Secondary | ICD-10-CM | POA: Insufficient documentation

## 2022-08-13 MED ORDER — AMLODIPINE BESYLATE 5 MG PO TABS: 5.0000 mg | ORAL_TABLET | Freq: Every day | ORAL | 3 refills | Status: AC

## 2022-08-13 NOTE — Progress Notes (Signed)
Subjective:    Patient ID: Theresa Schroeder, female   DOB: 11-01-1985, 36 y.o.   MRN: [id number]   HPI  Here to establish.   Blindness in right eye in 2012:  history of total retinal detachment as cause from what can glean from patient and chart.    2.  Left eye with history of angle closure glaucoma with iridectomy sometime before 2013 at [facility].  Reportedly, has also had cataract extraction from left eye.  Also with round hole, left eye being followed by [facility] [address].  Dr. Tama High is her primary ophthalmologist.    3.  Congenital Nystagmus:  She feels her vision is fair in her left eye.  She is not aware of the movement of her eyes, but states she is embarrassed by the movement.  4.  Hypertension/Obesity:  diagnosed perhaps in 2022.  She was initially on hygroton, but currently taking Amlodipine 5 mg daily. Not very physically active.  Works until 10 p.m. and then walks home--possible 1/4 of mile.  Works 4 days of week at [employer].  Also has 10 and 34 yo that go to [place]. Gets up at 6:30 am. To get her youngest off to school, a couple hours later, is doing the same with her oldest Goes back to bed around 8:40 a.m. for a long nap.  First eats when awakens from a nap at 1:30 to 2 p.m.:  grapes, drinks bottled green teas--diet.  Drinks a [employer], regular sugar--drinks 3-4 sixteen oz bottles.    Her mother cooks fried chicken with rice and a veggie. Drinks a [employer] or greentea.    Eats a lot of honey buns.  Does drink whole milk at times and likes it.     Had pap and physical at [facility] this past year.  Cannot say for certain when and if had labs done.  Would like to wait for labs to be obtained from that visit before having more done.  Current Meds  Medication Sig   acetaminophen (TYLENOL) 500 MG tablet Take 500 mg by mouth every 6 (six) hours as needed.   amLODipine (NORVASC) 5 MG tablet Take 5 mg by mouth daily.   No  Known Allergies  Past Medical History:  Diagnosis Date   Anemia    Hypertension 2022   Nystagmus    Sickle cell trait ([facility])    Past Surgical History:  Procedure Laterality Date   CATARACT EXTRACTION Left    CESAREAN SECTION  10/23/2007   Family History  Problem Relation Age of Onset   Healthy Mother    Obesity Mother    Cataracts Father    Miscarriages / [place] Sister    ADD / ADHD Son    Asthma Son    Heart murmur Son    Cancer Maternal Aunt    Diabetes Maternal Aunt    Early death Paternal Uncle    Anesthesia problems Neg Hx    Family Status  Relation Name Status   Mother  47, age 24y   Father  69, age 15y   Sister  Alive, age 53y   Son Berton Bon, age 33y   Son Wilhelmenia Blase, age 10y   Mat Aunt  (Not Specified)   Mat Uncle  (Not Specified)   Annamarie Major  (Not Specified)   Neg Hx  (Not Specified)        Review of Systems    Objective:   BP  130/82 (BP Location: Left Arm, Patient Position: Sitting, Cuff Size: Normal)   Pulse 96   Resp 12   Ht 5' 4.25" (1.632 m)   Wt 230 lb (104.3 kg)   BMI 39.17 kg/m   Physical Exam NAD HEENT:  left pupil is irregular and reactive to light.  Dense cataract or scarring of lens/corneal area of right eye, which is also discolored and appears atrophic.  Bidirectional bilateral horizontal nystagmus.  TMs pearly gray, throat without injection. Neck:  supple, No adenopathy, no thyromegaly Chest:  CTA CV:  RRR with normal S1 and S2, No S3, S4 or murmur.  No carotid bruits.  Carotid, radial and DP pulses normal and equal Abd:  S, NT, No HSM or mass, + BS LE:  No edema.    Assessment & Plan   Hypertension:  Continue Amlodipine.  Send for records from [facility] for most recent labs.    2.   Vision loss, right eye:  complete retinal detachment many years ago.    3.  Bilateral congenital nystagmus:  ophthalmology  4.  Round hole, history of angle closure glaucoma, s/p iridectomy and history of cataract  extraction, left eye:  [facility], Dr. Tama High.    5.  Obesity:  made goal to decrease soda intake by 12 ounces every week on a daily basis.  She will replace with 2% milk.  Should be off soda in about 4 weeks.  She will consider getting started on changing up her day so not sleeping until 1:30 p.m.  Will make new goals when follow up in 2 months.  6. Anemia:  will get records from [facility] and see what labs she needs checked.  7.  HM: influenza vaccine today.  Refused COVID--has not had any COVID vaccines.  Also will need Td this year.   Release of info before do labs Influenza left arm

## 2022-08-13 NOTE — Progress Notes (Signed)
    08/13/2022    4:01 PM 08/30/2021    8:37 AM 07/18/2020    1:33 PM 06/30/2018    4:18 PM  GAD 7 : Generalized Anxiety Score  Nervous, Anxious, on Edge 0 1 0 0  Control/stop worrying '1 1 1 1  '$ Worry too much - different things '1 1 1 1  '$ Trouble relaxing '1 2 1 1  '$ Restless 0 1 1 0  Easily annoyed or irritable '1 1 1 1  '$ Afraid - awful might happen 0 1 0   Total GAD 7 Score '4 8 5   '$ Anxiety Difficulty Not difficult at all           08/13/2022    4:02 PM 08/30/2021    8:36 AM 07/18/2020    1:32 PM 06/30/2018    4:19 PM  Depression screen PHQ 2/9  Decreased Interest 0 1 0 0  Down, Depressed, Hopeless '1 1 1 2  '$ PHQ - 2 Score '1 2 1 2  '$ Altered sleeping '1 1 1 1  '$ Tired, decreased energy 0 1 0 1  Change in appetite 0 1 0 2  Feeling bad or failure about yourself  '1 2 1 1  '$ Trouble concentrating 0 1 1 0  Moving slowly or fidgety/restless 0 0 0 0  Suicidal thoughts 1 1 0 0  PHQ-9 Score '4 9 4 7  '$ Difficult doing work/chores Not difficult at all       [place]: No Food Insecurity (08/13/2022)  Housing: Low Risk  (08/13/2022)  Transportation Needs: No Transportation Needs (08/13/2022)  Utilities: Not At Risk (08/13/2022)  Depression (PHQ2-9): Low Risk  (08/13/2022)  Tobacco Use: Low Risk  (08/13/2022)  Patient was informed of the services that [facility] provides and was told that if she needed any services to let us know.  Social worker informed Dr. Amil Amen

## 2022-08-13 NOTE — Patient Instructions (Signed)
Drink a glass of water before every meal Drink 6-8 glasses of water daily Eat three meals daily Eat a protein and healthy fat with every meal (eggs,fish, chicken, [place] and limit red meats) Eat 5 servings of vegetables daily, mix the colors Eat 2 servings of fruit daily with skin, if skin is edible Use smaller plates Put food/utensils down as you chew and swallow each bite Eat at a table with friends/family at least once daily, no TV Do not eat in front of the TV  Recent studies show that people who consume all of their calories in a 12 hour period lose weight more efficiently.  For example, if you eat your first meal at 7:00 a.m., your last meal of the day should be completed by 7:00 p.m.   Cut out 12 oz of soda for daily intake each week until off all soda. Can substitute 2% milk for the sodas.

## 2024-04-07 ENCOUNTER — Other Ambulatory Visit: Payer: Self-pay

## 2024-04-07 ENCOUNTER — Encounter (HOSPITAL_COMMUNITY): Payer: Self-pay

## 2024-04-07 ENCOUNTER — Emergency Department (HOSPITAL_COMMUNITY)
Admission: EM | Admit: 2024-04-07 | Discharge: 2024-04-07 | Disposition: A | Discharge status: Home / Self Care | Attending: Emergency Medicine | Admitting: Emergency Medicine

## 2024-04-07 ENCOUNTER — Emergency Department (HOSPITAL_COMMUNITY)

## 2024-04-07 DIAGNOSIS — R519 Headache, unspecified: Secondary | ICD-10-CM | POA: Diagnosis present

## 2024-04-07 DIAGNOSIS — M79604 Pain in right leg: Secondary | ICD-10-CM | POA: Diagnosis not present

## 2024-04-07 DIAGNOSIS — M79661 Pain in right lower leg: Secondary | ICD-10-CM | POA: Diagnosis not present

## 2024-04-07 DIAGNOSIS — I10 Essential (primary) hypertension: Secondary | ICD-10-CM | POA: Insufficient documentation

## 2024-04-07 DIAGNOSIS — Z79899 Other long term (current) drug therapy: Secondary | ICD-10-CM | POA: Insufficient documentation

## 2024-04-07 LAB — CBC
HCT: 30.5 % — ABNORMAL LOW (ref 36.0–46.0)
Hemoglobin: 10 g/dL — ABNORMAL LOW (ref 12.0–15.0)
MCH: 27.9 pg (ref 26.0–34.0)
MCHC: 32.8 g/dL (ref 30.0–36.0)
MCV: 85 fL (ref 80.0–100.0)
Platelets: 546 10*3/uL — ABNORMAL HIGH (ref 150–400)
RBC: 3.59 MIL/uL — ABNORMAL LOW (ref 3.87–5.11)
RDW: 13 % (ref 11.5–15.5)
WBC: 11.5 10*3/uL — ABNORMAL HIGH (ref 4.0–10.5)
nRBC: 0.3 % — ABNORMAL HIGH (ref 0.0–0.2)

## 2024-04-07 LAB — BASIC METABOLIC PANEL WITH GFR
Anion gap: 12 (ref 5–15)
BUN: 12 mg/dL (ref 6–20)
CO2: 25 mmol/L (ref 22–32)
Calcium: 9 mg/dL (ref 8.9–10.3)
Chloride: 103 mmol/L (ref 98–111)
Creatinine, Ser: 1.18 mg/dL — ABNORMAL HIGH (ref 0.44–1.00)
GFR, Estimated: >60 mL/min (ref >60)
Glucose, Bld: 120 mg/dL — ABNORMAL HIGH (ref 70–99)
Potassium: 3.9 mmol/L (ref 3.5–5.1)
Sodium: 140 mmol/L (ref 135–145)

## 2024-04-07 MED ORDER — ACETAMINOPHEN 500 MG PO TABS
1000.0000 mg | ORAL_TABLET | Freq: Once | ORAL | Status: AC
  Administered 2024-04-07: 1000 mg via ORAL

## 2024-04-07 NOTE — ED Triage Notes (Signed)
 Pt to ED by EMS from home with c/o a headache for the past week. Pt also endorses R leg pain which began today, nontraumatic in nature. VSS, NADN. Rates pain 4/10.

## 2024-04-07 NOTE — ED Notes (Addendum)
 Pt states that she usually has headaches that comes and go and that's normal for her. Denies any headaches or dizziness at this moment. Pt states that her balance has been off since 04/03/24. Notes right eye blindness since 2009 and endorses right sided facial droop for years. She notes her right leg begin to feel heavy at midnight and was unable to lift her foot, endorses leg pressure now and feeling shob.

## 2024-04-07 NOTE — Progress Notes (Signed)
 Right lower ext venous  has been completed. Refer to [facility] under chart review to view preliminary results.   04/07/2024  9:13 AM Lynia Landry, Hollace Lund

## 2024-04-07 NOTE — ED Provider Triage Note (Signed)
 Emergency Medicine Provider Triage Evaluation Note  Theresa Schroeder , a 38 y.o. female  was evaluated in triage.  Pt complains of headache.  Intermittent for a few weeks now.  Seems to get better with tylenol /motrin .  Saw PCP who felt like it may be stress related.  No prior hx of migraines.  No fever, neck pain, numbness, weakness.  Pain 4/10 currently, took OTC meds prior to leaving home.  Review of Systems  Positive: headache Negative: fever  Physical Exam  BP (!) 122/91 (BP Location: Right Arm)   Pulse 92   Temp 99.5 F (37.5 C)   Resp 16   Wt 105 kg   SpO2 97%   BMI 39.43 kg/m  Gen:   Awake, no distress   Resp:  Normal effort  MSK:   Moves extremities without difficulty  Other:  AAOx3, no focal deficits  Medical Decision Making  Medically screening exam initiated at 1:38 AM.  Appropriate orders placed.  KRISTIANNA SAPERSTEIN was informed that the remainder of the evaluation will be completed by another provider, this initial triage assessment does not replace that evaluation, and the importance of remaining in the ED until their evaluation is complete.  Headache, intermittent x few weeks.  Better with OTC meds.  No focal deficits.  Basic labs sent.   Coretha Dew, PA-C 04/07/24 0139

## 2024-04-07 NOTE — ED Provider Notes (Signed)
 [place] EMERGENCY DEPARTMENT AT [facility] Provider Note   CSN: [id number] Arrival date & time: 04/07/24  9942     Patient presents with: Headache  HPI Theresa Schroeder is a 38 y.o. female presenting for headache and right leg pain. She states the headache has been going on for the past week.  It is intermittent and mild.  Denies nuchal rigidity and fever.  Denies thunderclap symptoms and visual disturbance.  She states at this moment she does not have a headache.  Also reports pain in both legs but has been notably worse in the right leg.  She states the pain was in the lower leg but now is in her thigh.  It is worse with ambulation.  Denies any trauma to the leg.  Denies chest pain shortness of breath.  Past Medical History:  Diagnosis Date   Anemia    Hypertension 2022   Nystagmus    Sickle cell trait (HCC)       Headache     Prior to Admission medications   Medication Sig Start Date End Date Taking? Authorizing Provider  acetaminophen  (TYLENOL ) 500 MG tablet Take 500 mg by mouth every 6 (six) hours as needed.    [provider]  amLODipine  (NORVASC ) 5 MG tablet Take 1 tablet (5 mg total) by mouth daily. 08/13/22   Adella Norris, MD  fluticasone  (FLONASE ) 50 MCG/ACT nasal spray Place 1-2 sprays into both nostrils daily for 7 days. 10/28/18 06/12/19  Wieters, Hallie C, PA-C    Allergies: Patient has no known allergies.    Review of Systems  Neurological:  Positive for headaches.   Updated Vital Signs BP 124/77   Pulse 82   Temp 98.1 F (36.7 C)   Resp (!) 23   Wt 105 kg   SpO2 100%   BMI 39.43 kg/m   Physical Exam Vitals and nursing note reviewed.  HENT:     Head: Normocephalic and atraumatic.     Mouth/Throat:     Mouth: Mucous membranes are moist.   Eyes:     General:        Right eye: No discharge.        Left eye: No discharge.     Conjunctiva/sclera: Conjunctivae normal.    Cardiovascular:     Rate and Rhythm: Normal  rate and regular rhythm.     Pulses: Normal pulses.     Heart sounds: Normal heart sounds.  Pulmonary:     Effort: Pulmonary effort is normal.     Breath sounds: Normal breath sounds.  Abdominal:     General: Abdomen is flat.     Palpations: Abdomen is soft.   Musculoskeletal:     Right upper leg: Normal.     Left upper leg: Normal.     Right lower leg: Normal.     Left lower leg: Normal.     Right foot: Normal pulse.     Left foot: Normal pulse.   Skin:    General: Skin is warm and dry.   Neurological:     General: No focal deficit present.     Comments: GCS 15. Speech is goal oriented. No deficits appreciated to CN III-XII; symmetric eyebrow raise, no facial drooping, tongue midline. Patient has equal grip strength bilaterally with 5/5 strength against resistance in all major muscle groups bilaterally. Sensation to light touch intact. Patient moves extremities without ataxia. Normal finger-nose-finger. Patient ambulatory with steady gait.  Psychiatric:  Mood and Affect: Mood normal.     (all labs ordered are listed, but only abnormal results are displayed) Labs Reviewed  CBC - Abnormal; Notable for the following components:      Result Value   WBC 11.5 (*)    RBC 3.59 (*)    Hemoglobin 10.0 (*)    HCT 30.5 (*)    Platelets 546 (*)    nRBC 0.3 (*)    All other components within normal limits  BASIC METABOLIC PANEL WITH GFR - Abnormal; Notable for the following components:   Glucose, Bld 120 (*)    Creatinine, Ser 1.18 (*)    All other components within normal limits    EKG: EKG Interpretation Date/Time:  Tuesday April 07 2024 07:23:19 EDT Ventricular Rate:  73 PR Interval:  158 QRS Duration:  101 QT Interval:  423 QTC Calculation: 467 R Axis:   65  Text Interpretation: Sinus rhythm Confirmed by Darra Chew [phone]) on 04/07/2024 7:25:49 AM  Radiology: VAS US  LOWER EXTREMITY VENOUS (DVT) (7a-7p) Result Date: 04/07/2024  Lower Venous DVT Study Patient  Name:  Theresa Schroeder  Date of Exam:   04/07/2024 Medical Rec #: [id number]         Accession #:    [id number] Date of Birth: [birth year]          Patient Gender: F Patient Age:   6 years Exam Location:  [facility] Procedure:      VAS US  LOWER EXTREMITY VENOUS (DVT) Referring Phys: NORLEEN ESSEX --------------------------------------------------------------------------------  Indications: Pain in right leg for several days.  Comparison Study: No priors. Performing Technologist: Ricka Sturdivant-Jones RDMS, RVT  Examination Guidelines: A complete evaluation includes B-mode imaging, spectral Doppler, color Doppler, and power Doppler as needed of all accessible portions of each vessel. Bilateral testing is considered an integral part of a complete examination. Limited examinations for reoccurring indications may be performed as noted. The reflux portion of the exam is performed with the patient in reverse Trendelenburg.  +---------+---------------+---------+-----------+----------+--------------+ RIGHT    CompressibilityPhasicitySpontaneityPropertiesThrombus Aging +---------+---------------+---------+-----------+----------+--------------+ CFV      Full           Yes      Yes                                 +---------+---------------+---------+-----------+----------+--------------+ SFJ      Full                                                        +---------+---------------+---------+-----------+----------+--------------+ FV Prox  Full                                                        +---------+---------------+---------+-----------+----------+--------------+ FV Mid   Full                                                        +---------+---------------+---------+-----------+----------+--------------+ FV DistalFull  Yes      Yes                                 +---------+---------------+---------+-----------+----------+--------------+ PFV      Full                                                         +---------+---------------+---------+-----------+----------+--------------+ POP      Full           Yes      Yes                                 +---------+---------------+---------+-----------+----------+--------------+ PTV      Full                                                        +---------+---------------+---------+-----------+----------+--------------+ PERO     Full                                                        +---------+---------------+---------+-----------+----------+--------------+   +----+---------------+---------+-----------+----------+--------------+ LEFTCompressibilityPhasicitySpontaneityPropertiesThrombus Aging +----+---------------+---------+-----------+----------+--------------+ CFV Full           Yes      Yes                                 +----+---------------+---------+-----------+----------+--------------+ SFJ Full                                                        +----+---------------+---------+-----------+----------+--------------+     Summary: RIGHT: - There is no evidence of deep vein thrombosis in the lower extremity.  - No cystic structure found in the popliteal fossa. A small lymph nodes noted proximal thigh  LEFT: - No evidence of common femoral vein obstruction.   *See table(s) above for measurements and observations.    Preliminary      Procedures   Medications Ordered in the ED  acetaminophen  (TYLENOL ) tablet 1,000 mg (1,000 mg Oral Given 04/07/24 0813)                                   Medical Decision Making Amount and/or Complexity of Data Reviewed Labs: ordered.  Risk OTC drugs.   Initial Impression and Ddx 38 yo well appearing female presenting headache and leg pain.  Exam was unremarkable.  DDx includes ICH, hypertensive emergency, complicated migraine, meningitis, for the leg pain: PE, fracture dislocation, PID, cellulitis, PE, other. Patient  PMH that increases complexity of ED encounter:  anemia, HTN,  Interpretation of Diagnostics - I independent reviewed and interpreted the labs as  followed: anemia (near baseline)  - I independently visualized the following imaging with scope of interpretation limited to determining acute life threatening conditions related to emergency care: US  right leg, which revealed negative for DVT  - I personally reviewed and interpreted EKG which revealed sinus rhythm   Patient Reassessment and Ultimate Disposition/Management On assessment, patient remained without headache, well-appearing no acute distress and hemodynamically stable.  Evaluation of the right leg was grossly normal and low suspicion for DVT but felt it warranted further evaluation given that her symptoms had worsened over the last week.  Fortunately that study was negative.  Headache is likely benign given it is intermittent and no concerning associated symptoms.  Advised PCP follow-up for her intermittent headaches and right leg pain.  Advised supportive treatment at home.  Discussed return precautions.  Discharged good condition.  Patient management required discussion with the following services or consulting groups:  None  Complexity of Problems Addressed Acute complicated illness or Injury  Additional Data Reviewed and Analyzed Further history obtained from: Past medical history and medications listed in the EMR and Prior ED visit notes  Patient Encounter Risk Assessment None      Final diagnoses:  Nonintractable headache, unspecified chronicity pattern, unspecified headache type  Right leg pain    ED Discharge Orders     None          Lang Norleen POUR, PA-C 04/07/24 [id number]    Darra Fonda MATSU, MD 04/17/24 1806

## 2024-04-07 NOTE — Discharge Instructions (Addendum)
 Evaluation today was overall reassuring.  Your ultrasound of your right leg was normal.  Please follow-up your PCP.  You can take Tylenol  ibuprofen  at home for your headache and leg pain.  If your symptoms worsen please return to the ED for further evaluation.

## 2024-04-07 NOTE — ED Notes (Signed)
 Pt in bed, pt denies headache, pt states that she is ready to go home, states that she is going to call a ride to go home, pt verbalized understanding d/c instructions and follow up, pt from department.

## 2024-04-22 ENCOUNTER — Ambulatory Visit (INDEPENDENT_AMBULATORY_CARE_PROVIDER_SITE_OTHER): Admitting: Podiatry

## 2024-04-22 ENCOUNTER — Ambulatory Visit (INDEPENDENT_AMBULATORY_CARE_PROVIDER_SITE_OTHER)

## 2024-04-22 ENCOUNTER — Encounter: Payer: Self-pay | Admitting: Podiatry

## 2024-04-22 VITALS — Ht 64.25 in | Wt 231.5 lb

## 2024-04-22 DIAGNOSIS — M76821 Posterior tibial tendinitis, right leg: Secondary | ICD-10-CM | POA: Diagnosis not present

## 2024-04-22 DIAGNOSIS — M7751 Other enthesopathy of right foot: Secondary | ICD-10-CM

## 2024-04-22 MED ORDER — BETAMETHASONE SOD PHOS & ACET 6 (3-3) MG/ML IJ SUSP
3.0000 mg | Freq: Once | INTRAMUSCULAR | Status: AC
  Administered 2024-04-22: 3 mg via INTRA_ARTICULAR

## 2024-04-22 NOTE — Progress Notes (Signed)
   Chief Complaint  Patient presents with   Foot Pain    Pt is here due to right foot pain states she has been having the pain for a few months, states sometimes she can not walk on foot due to pain no previous Injury.    Subjective:  38 y.o. female presenting today as a new patient for evaluation of pain and tenderness associated to the right foot.  No previous injury.  She works on her feet all day.   Past Medical History:  Diagnosis Date   Anemia    Hypertension 2022   Nystagmus    Sickle cell trait [facility])    Past Surgical History:  Procedure Laterality Date   CATARACT EXTRACTION Left    CESAREAN SECTION  10/23/2007   No Known Allergies     Objective/Physical Exam General: The patient is alert and oriented x3 in no acute distress.  Dermatology: Skin is warm, dry and supple bilateral lower extremities. Negative for open lesions or macerations.  Vascular: Palpable pedal pulses bilaterally. No edema or erythema noted. Capillary refill within normal limits.  Neurological: Grossly intact via light touch  Musculoskeletal Exam: Range of motion within normal limits to all pedal and ankle joints bilateral. Muscle strength 5/5 in all groups bilateral. Upon weightbearing there is a medial longitudinal arch collapse bilaterally. Remove foot valgus noted to the bilateral lower extremities with excessive pronation upon mid stance. Tenderness to palpation noted along the posterior tibial tendon as it inserts onto the foot  Radiographic Exam RT foot 04/22/2024:  Collapse of the medial longitudinal arch of the foot noted on lateral view.  Medial deviation of the talar head also noted.  Findings consistent with pes planus deformity  Assessment: 1. pes planus bilateral 2.  Posterior tibial tendinitis right   Plan of Care:  -Patient was evaluated. X-Rays reviewed.  -Injection of 0.5 cc Celestone Soluspan injected along the posterior tibial tendon right  -Advise against going barefoot.   Recommend good supportive tennis shoes and sneakers -OTC power step insoles were dispensed.  Wear daily -Return to clinic PRN  *Works on her feet all day at [employer]   Thresa EMERSON Sar, DPM [facility]  Dr. Thresa EMERSON Sar, DPM    [address]                                        [place], [place] [address]                Office [phone]  Fax [phone]
